# Patient Record
Sex: Female | Born: 1995
Health system: Southern US, Community
[De-identification: ages and names within clinical notes are randomized; demographics above are authoritative.]

## PROBLEM LIST (undated history)

## (undated) DIAGNOSIS — A549 Gonococcal infection, unspecified: Secondary | ICD-10-CM

## (undated) DIAGNOSIS — I10 Essential (primary) hypertension: Secondary | ICD-10-CM

## (undated) DIAGNOSIS — D573 Sickle-cell trait: Secondary | ICD-10-CM

## (undated) DIAGNOSIS — A749 Chlamydial infection, unspecified: Secondary | ICD-10-CM

## (undated) HISTORY — DX: Essential (primary) hypertension: I10

## (undated) HISTORY — PX: ADENOIDECTOMY: SHX5191

## (undated) HISTORY — PX: TONSILLECTOMY: SUR1361

---
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
## 2013-04-12 ENCOUNTER — Emergency Department (HOSPITAL_COMMUNITY)
Admission: EM | Admit: 2013-04-12 | Discharge: 2013-04-12 | Disposition: A | Discharge status: Home / Self Care | Payer: Medicaid Other | Attending: Emergency Medicine | Admitting: Emergency Medicine

## 2013-04-12 ENCOUNTER — Encounter (HOSPITAL_COMMUNITY): Payer: Self-pay | Admitting: Emergency Medicine

## 2013-04-12 DIAGNOSIS — D573 Sickle-cell trait: Secondary | ICD-10-CM | POA: Insufficient documentation

## 2013-04-12 DIAGNOSIS — Z3202 Encounter for pregnancy test, result negative: Secondary | ICD-10-CM | POA: Insufficient documentation

## 2013-04-12 DIAGNOSIS — R109 Unspecified abdominal pain: Secondary | ICD-10-CM | POA: Insufficient documentation

## 2013-04-12 DIAGNOSIS — H6120 Impacted cerumen, unspecified ear: Secondary | ICD-10-CM | POA: Insufficient documentation

## 2013-04-12 DIAGNOSIS — J069 Acute upper respiratory infection, unspecified: Secondary | ICD-10-CM

## 2013-04-12 DIAGNOSIS — Z79899 Other long term (current) drug therapy: Secondary | ICD-10-CM | POA: Insufficient documentation

## 2013-04-12 DIAGNOSIS — H6121 Impacted cerumen, right ear: Secondary | ICD-10-CM

## 2013-04-12 DIAGNOSIS — R51 Headache: Secondary | ICD-10-CM | POA: Insufficient documentation

## 2013-04-12 DIAGNOSIS — Z8719 Personal history of other diseases of the digestive system: Secondary | ICD-10-CM | POA: Insufficient documentation

## 2013-04-12 LAB — URINALYSIS, ROUTINE W REFLEX MICROSCOPIC
Glucose, UA: NEGATIVE mg/dL
Ketones, ur: NEGATIVE mg/dL
Nitrite: NEGATIVE
Specific Gravity, Urine: 1.009 (ref 1.005–1.030)
Urobilinogen, UA: 0.2 mg/dL (ref 0.0–1.0)

## 2013-04-12 LAB — PREGNANCY, URINE: Preg Test, Ur: NEGATIVE

## 2013-04-12 LAB — URINE MICROSCOPIC-ADD ON

## 2013-04-12 MED ORDER — ACETAMINOPHEN 325 MG PO TABS
650.0000 mg | ORAL_TABLET | Freq: Once | ORAL | Status: AC
  Administered 2013-04-12: 650 mg via ORAL
  Filled 2013-04-12: qty 2

## 2013-04-12 NOTE — ED Notes (Signed)
Pt. reports nasal congestion with occasional dry cough unrelieved by OTC cold medications . Denies fever or chills. Occasional upper abdominal cramping .

## 2013-04-12 NOTE — ED Provider Notes (Signed)
CSN: [id number]     Arrival date & time 04/12/13  1603 History  This chart was scribed for non-physician practitioner, Coral Ceo, PA-C working with Raeford Razor, MD by Greggory Stallion, ED scribe. This patient was seen in room TR04C/TR04C and the patient's care was started at 6:22 PM.   Chief Complaint  Patient presents with  . Nasal Congestion   The history is provided by the patient. No language interpreter was used.    HPI Comments: Pamela Patterson is a 17 y.o. female with h/o diverticulitis who presents to the Emergency Department complaining of nasal congestion.  Patient has multiple complaints.  Patient complains of a nonproductive cough and itchy throat that started one week ago.  She denies any hemoptysis, difficulty breathing or swallowing, voice change, chest pain, or shortness of breath. Pt states she has had right ear pain for the past week which is described as an intermittent "popping sensation."  No hearing loss, tinnitus, or ear drainage.  She also has a generalized mild headache for the past week with no vision changes, weakness, loss of sensation, numbness or tingling. She states she is also having intermittent sharp abdominal pain for the past several weeks.  Abdominal pain only lasts a few seconds and resolves.  The abdominal pain is normally right before and after she eats and is located on different locations throughout the abdomen. She denies any abdominal pain currently.  Patient has a history of diverticulitis however she states that her pain is not similar to this. She denies any rectal bleeding or pain, nausea, vomiting, diarrhea, constipation, vaginal discharge, vaginal bleeding, dysuria, or hematuria. Pt has tried OTC cold medications with no relief. She denies appetite change, fever, chills, rash, back pain, neck pain, dizziness, lightheadedness, and leg edema.  Pt denies smoking cigarettes. Sick contacts include her roommate who is also in the emergency room for similar URI  complaints.    Past Medical History  Diagnosis Date  . Diverticulitis   . Sickle cell trait    History reviewed. No pertinent past surgical history. No family history on file. History  Substance Use Topics  . Smoking status: Never Smoker   . Smokeless tobacco: Not on file  . Alcohol Use: No   OB History   Grav Para Term Preterm Abortions TAB SAB Ect Mult Living                 Review of Systems  Constitutional: Negative for fever, chills, activity change, appetite change and fatigue.  HENT: Positive for ear pain, congestion, sore throat and rhinorrhea. Negative for hearing loss, drooling, mouth sores, trouble swallowing, neck pain, neck stiffness, dental problem, voice change, sinus pressure, tinnitus and ear discharge.   Eyes: Negative for photophobia, itching and visual disturbance.  Respiratory: Positive for cough. Negative for apnea, choking, shortness of breath and wheezing.   Cardiovascular: Negative for chest pain, palpitations and leg swelling.  Gastrointestinal: Positive for abdominal pain. Negative for nausea, vomiting, diarrhea, constipation, blood in stool and rectal pain.  Genitourinary: Negative for dysuria, hematuria and difficulty urinating.  Musculoskeletal: Negative for back pain and gait problem.  Skin: Negative for rash and wound.  Neurological: Positive for headaches. Negative for dizziness, syncope, weakness, light-headedness and numbness.  Psychiatric/Behavioral: Negative for confusion.  All other systems reviewed and are negative.    Allergies  Review of patient's allergies indicates no known allergies.  Home Medications   Current Outpatient Rx  Name  Route  Sig  Dispense  Refill  .  Pseudoeph-Doxylamine-DM-APAP (NYQUIL PO)   Oral   Take 30 mLs by mouth at bedtime as needed (congestion).         . Pseudoephedrine-APAP-DM (DAYQUIL PO)   Oral   Take 30 mLs by mouth daily as needed (congestion).          BP 138/77  Pulse 96  Temp(Src) 98.5  F (36.9 C) (Oral)  Resp 18  Ht 5\' 3"  (1.6 m)  Wt 184 lb (83.462 kg)  BMI 32.6 kg/m2  SpO2 99%  LMP 04/06/2013  Filed Vitals:   04/12/13 1625 04/12/13 2011  BP: 138/77 129/83  Pulse: 96 92  Temp: 98.5 F (36.9 C) 98.6 F (37 C)  TempSrc: Oral   Resp: 18 18  Height: 5\' 3"  (1.6 m)   Weight: 184 lb (83.462 kg)   SpO2: 99% 100%     Physical Exam  Nursing note and vitals reviewed. Constitutional: She is oriented to person, place, and time. She appears well-developed and well-nourished. No distress.  HENT:  Head: Normocephalic and atraumatic.  Right Ear: External ear normal.  Left Ear: External ear normal.  Mouth/Throat: Oropharynx is clear and moist. No oropharyngeal exudate.  Left tympanic membrane gray and translucent. Unable to visualize right tympanic membrane due to cerumen. Uvula midline. No trismus.  Rhinorrhea present. No tenderness to palpation to the frontal or maxillary sinuses throughout  Eyes: Conjunctivae and EOM are normal. Pupils are equal, round, and reactive to light. Right eye exhibits no discharge. Left eye exhibits no discharge.  Neck: Normal range of motion. Neck supple. No tracheal deviation present.  No cervical spinal or paraspinal tenderness to palpation. No lymphadenopathy.  Cardiovascular: Normal rate, regular rhythm, normal heart sounds and intact distal pulses.  Exam reveals no gallop and no friction rub.   No murmur heard. Radial and dorsalis pedis pulses present bilaterally  Pulmonary/Chest: Effort normal and breath sounds normal. No respiratory distress. She has no wheezes. She has no rales. She exhibits no tenderness.  Abdominal: Soft. She exhibits no distension and no mass. There is no tenderness. There is no rebound and no guarding.  Musculoskeletal: Normal range of motion. She exhibits no edema and no tenderness.  Patient able to ambulate without difficulty or ataxia.   Lymphadenopathy:    She has no cervical adenopathy.  Neurological: She  is alert and oriented to person, place, and time.  GCS 15. No focal neurological deficits.  Skin: Skin is warm and dry. She is not diaphoretic.  Psychiatric: She has a normal mood and affect. Her behavior is normal.    ED Course  EAR CERUMEN REMOVAL Date/Time: 04/12/2013 7:00 PM Performed by: Coral Ceo K Authorized by: Jillyn Ledger Consent: Verbal consent obtained. Consent given by: patient Patient identity confirmed: verbally with patient Local anesthetic: none Location details: right ear Procedure type: irrigation Patient sedated: no Patient tolerance: Patient tolerated the procedure well with no immediate complications.   (including critical care time)  DIAGNOSTIC STUDIES: Oxygen Saturation is 99% on RA, normal by my interpretation.    COORDINATION OF CARE: 6:28 PM-Discussed treatment plan which includes flushing right ear out and UA with pt at bedside and pt agreed to plan. Advised her xrays and strep test are not necessary and pt agrees.    Labs Review Labs Reviewed  URINALYSIS, ROUTINE W REFLEX MICROSCOPIC - Abnormal; Notable for the following:    APPearance HAZY (*)    Hgb urine dipstick LARGE (*)    Leukocytes, UA SMALL (*)  All other components within normal limits  URINE MICROSCOPIC-ADD ON - Abnormal; Notable for the following:    Squamous Epithelial / LPF MANY (*)    Bacteria, UA FEW (*)    All other components within normal limits  URINE CULTURE  PREGNANCY, URINE   Imaging Review No results found.  Results for orders placed during the hospital encounter of 04/12/13  URINE CULTURE      Result Value Range   Specimen Description URINE, CLEAN CATCH     Special Requests NONE     Culture  Setup Time       Value: 04/12/2013 21:35     Performed at [facility] Count       Value: >=100,000 COLONIES/ML     Performed at [facility]   Culture       Value: KLEBSIELLA PNEUMONIAE     Performed at [facility]    Report Status 04/14/2013 FINAL     Organism ID, Bacteria KLEBSIELLA PNEUMONIAE    URINALYSIS, ROUTINE W REFLEX MICROSCOPIC      Result Value Range   Color, Urine YELLOW  YELLOW   APPearance HAZY (*) CLEAR   Specific Gravity, Urine 1.009  1.005 - 1.030   pH 7.5  5.0 - 8.0   Glucose, UA NEGATIVE  NEGATIVE mg/dL   Hgb urine dipstick LARGE (*) NEGATIVE   Bilirubin Urine NEGATIVE  NEGATIVE   Ketones, ur NEGATIVE  NEGATIVE mg/dL   Protein, ur NEGATIVE  NEGATIVE mg/dL   Urobilinogen, UA 0.2  0.0 - 1.0 mg/dL   Nitrite NEGATIVE  NEGATIVE   Leukocytes, UA SMALL (*) NEGATIVE  PREGNANCY, URINE      Result Value Range   Preg Test, Ur NEGATIVE  NEGATIVE  URINE MICROSCOPIC-ADD ON      Result Value Range   Squamous Epithelial / LPF MANY (*) RARE   WBC, UA 7-10  <3 WBC/hpf   RBC / HPF 0-2  <3 RBC/hpf   Bacteria, UA FEW (*) RARE     MDM   1. URI (upper respiratory infection)   2. Cerumen impaction, right      Pamela Patterson is a 17 y.o. female with h/o diverticulitis who presents to the Emergency Department complaining of nasal congestion and multiple other complaints. Urinalysis and urine pregnancy ordered to further evaluate.   Rechecks  7:00 PM = cerumen flushed out with warm water and hydrogen peroxide. Tympanic membrane is gray and translucent on the right.  7:33 PM = Pt was walking around hall outside of room socializing with her friend in the next room over.   Patient was evaluated in the emergency department for multiple complaints including ear pain, nasal congestion, sore throat, cough, abdominal pain and headache.  Lungs were clear to auscultation and her abdominal exam was benign.  Right ear pain likely due to to a cerumen impaction which was irrigated in the emergency department.  Cough and nasal congestion are likely due to an upper respiratory infection.  Patient had no complaints of dysuria. Urinalysis not highly suggestive of a UTI at this time. Will send for culture.  She was  afebrile, nontoxic in appearance and remained in no acute distress throughout her ED visit. She was instructed to followup with her primary care provider for further evaluation and management of her multiple complaints.  Patient was instructed to return to the ED if they experience any fever, difficulty swallowing or breathing, repeated vomiting, severe or worsening abdominal pain, severe  headache, or other concerns.  Patient was in agreement with discharge and plan.      Final impressions: 1. upper respiratory infection 2. cerumen impaction right     Luiz Iron PA-C   I personally performed the services described in this documentation, which was scribed in my presence. The recorded information has been reviewed and is accurate.   Jillyn Ledger, PA-C 04/15/13 [phone]

## 2013-04-14 LAB — URINE CULTURE: Colony Count: >=100000

## 2013-04-15 NOTE — ED Provider Notes (Signed)
Medical screening examination/treatment/procedure(s) were performed by non-physician practitioner and as supervising physician I was immediately available for consultation/collaboration.  Raeford Razor, MD 04/15/13 [phone]

## 2013-04-16 ENCOUNTER — Telehealth (HOSPITAL_COMMUNITY): Payer: Self-pay | Admitting: Emergency Medicine

## 2013-04-16 NOTE — ED Notes (Signed)
Post ED Visit - Positive Culture Follow-up  Culture report reviewed by antimicrobial stewardship pharmacist: []  [name], Pharm.D., BCPS [x]  [name], Pharm.D., BCPS []  [name], [address].D., BCPS []  [name], [name], BCPS, AAHIVP []  [name], Pharm.D., BCPS, AAHIVP  Positive urine culture No treatment and no further patient follow-up is required at this time.  Pamela Patterson 04/16/2013, 1:38 PM

## 2013-04-25 ENCOUNTER — Emergency Department (HOSPITAL_COMMUNITY)
Admission: EM | Admit: 2013-04-25 | Discharge: 2013-04-25 | Disposition: A | Discharge status: Home / Self Care | Payer: Medicaid Other | Attending: Emergency Medicine | Admitting: Emergency Medicine

## 2013-04-25 ENCOUNTER — Encounter (HOSPITAL_COMMUNITY): Payer: Self-pay | Admitting: *Deleted

## 2013-04-25 DIAGNOSIS — Z862 Personal history of diseases of the blood and blood-forming organs and certain disorders involving the immune mechanism: Secondary | ICD-10-CM | POA: Insufficient documentation

## 2013-04-25 DIAGNOSIS — Z8719 Personal history of other diseases of the digestive system: Secondary | ICD-10-CM | POA: Insufficient documentation

## 2013-04-25 DIAGNOSIS — Y939 Activity, unspecified: Secondary | ICD-10-CM | POA: Insufficient documentation

## 2013-04-25 DIAGNOSIS — S298XXA Other specified injuries of thorax, initial encounter: Secondary | ICD-10-CM | POA: Insufficient documentation

## 2013-04-25 DIAGNOSIS — Y92410 Unspecified street and highway as the place of occurrence of the external cause: Secondary | ICD-10-CM | POA: Insufficient documentation

## 2013-04-25 MED ORDER — IBUPROFEN 800 MG PO TABS: 800.0000 mg | ORAL_TABLET | Freq: Three times a day (TID) | ORAL | Status: DC

## 2013-04-25 MED ORDER — METHOCARBAMOL 500 MG PO TABS: 500.0000 mg | ORAL_TABLET | Freq: Two times a day (BID) | ORAL | Status: DC
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
## 2013-04-25 NOTE — ED Notes (Signed)
Pt called in waiting room by [facility] RN with no succcess pt not in room or waiting area. Discharge instructions left with nurse first in case of pt arrival.

## 2013-04-25 NOTE — ED Notes (Signed)
Pt only c/o chest pain when hyperextends thoracic cavity-no pain in supine/sitting position.

## 2013-04-25 NOTE — ED Provider Notes (Signed)
Medical screening examination/treatment/procedure(s) were performed by non-physician practitioner and as supervising physician I was immediately available for consultation/collaboration.  Breona Cherubin L Colena Ketterman, MD 04/25/13 1720 

## 2013-04-25 NOTE — ED Notes (Signed)
Report recived but pt not in the room.

## 2013-04-25 NOTE — ED Notes (Signed)
Pt left ED without notifying the staff.Pt did not recive discharge instruction and paper.
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
## 2013-07-25 ENCOUNTER — Emergency Department (HOSPITAL_COMMUNITY)
Admission: EM | Admit: 2013-07-25 | Discharge: 2013-07-25 | Disposition: A | Discharge status: Home / Self Care | Payer: BC Managed Care – PPO | Attending: Emergency Medicine | Admitting: Emergency Medicine

## 2013-07-25 ENCOUNTER — Encounter (HOSPITAL_COMMUNITY): Payer: Self-pay | Admitting: Emergency Medicine

## 2013-07-25 DIAGNOSIS — Z8719 Personal history of other diseases of the digestive system: Secondary | ICD-10-CM | POA: Insufficient documentation

## 2013-07-25 DIAGNOSIS — Z862 Personal history of diseases of the blood and blood-forming organs and certain disorders involving the immune mechanism: Secondary | ICD-10-CM | POA: Insufficient documentation

## 2013-07-25 DIAGNOSIS — R319 Hematuria, unspecified: Secondary | ICD-10-CM

## 2013-07-25 DIAGNOSIS — Z791 Long term (current) use of non-steroidal anti-inflammatories (NSAID): Secondary | ICD-10-CM | POA: Insufficient documentation

## 2013-07-25 LAB — URINALYSIS, ROUTINE W REFLEX MICROSCOPIC
Bilirubin Urine: NEGATIVE
GLUCOSE, UA: NEGATIVE mg/dL
Ketones, ur: NEGATIVE mg/dL
LEUKOCYTES UA: NEGATIVE
Nitrite: NEGATIVE
Protein, ur: NEGATIVE mg/dL
Specific Gravity, Urine: 1.01 (ref 1.005–1.030)
Urobilinogen, UA: 0.2 mg/dL (ref 0.0–1.0)
pH: 7.5 (ref 5.0–8.0)

## 2013-07-25 LAB — URINE MICROSCOPIC-ADD ON

## 2013-07-25 LAB — POCT PREGNANCY, URINE: PREG TEST UR: NEGATIVE

## 2013-07-25 NOTE — ED Notes (Signed)
Pt reports having blood in urine since yesterday and urinary frequency, denies any pain.

## 2013-07-25 NOTE — ED Notes (Signed)
Discharge and follow up instructions reviewed with pt. Pt verbalized understanding.  

## 2013-07-25 NOTE — Discharge Instructions (Signed)
As discussed you need to have your urine rechecked in the next two weeks to see if the symptoms resolve. If not then you need to follow up with urology as discussedHematuria, Adult Hematuria (blood in your urine) can be caused by a bladder infection (cystitis), kidney infection (pyelonephritis), prostate infection (prostatitis), or kidney stone. Infections will usually respond to antibiotics (medications which kill germs), and a kidney stone will usually pass through your urine without further treatment. If you were put on antibiotics, take all the medicine until gone. You may feel better in a few days, but take all of your medicine or the infection may not respond and become more difficult to treat. If antibiotics were not given, an infection did not cause the blood in the urine. A further work up to find out the reason may be needed. HOME CARE INSTRUCTIONS   Drink lots of fluid, 3 to 4 quarts a day. If you have been diagnosed with an infection, cranberry juice is especially recommended, in addition to large amounts of water.  Avoid caffeine, tea, and carbonated beverages, because they tend to irritate the bladder.  Avoid alcohol as it may irritate the prostate.  Only take over-the-counter or prescription medicines for pain, discomfort, or fever as directed by your caregiver.  If you have been diagnosed with a kidney stone follow your caregivers instructions regarding straining your urine to catch the stone. TO PREVENT FURTHER INFECTIONS:  Empty the bladder often. Avoid holding urine for long periods of time.  After a bowel movement, women should cleanse front to back. Use each tissue only once.  Empty the bladder before and after sexual intercourse if you are a female.  Return to your caregiver if you develop back pain, fever, nausea (feeling sick to your stomach), vomiting, or your symptoms (problems) are not better in 3 days. Return sooner if you are getting worse. If you have been  requested to return for further testing make sure to keep your appointments. If an infection is not the cause of blood in your urine, X-rays may be required. Your caregiver will discuss this with you. SEEK IMMEDIATE MEDICAL CARE IF:   You have a persistent fever over 102 F (38.9 C).  You develop severe vomiting and are unable to keep the medication down.  You develop severe back or abdominal pain despite taking your medications.  You begin passing a large amount of blood or clots in your urine.  You feel extremely weak or faint, or pass out. MAKE SURE YOU:   Understand these instructions.  Will watch your condition.  Will get help right away if you are not doing well or get worse. Document Released: 07/08/2005 Document Revised: 09/30/2011 Document Reviewed: 02/25/2008 [facility] Patient Information 2014 [place], [place].

## 2013-07-25 NOTE — ED Notes (Signed)
Pt states she has some urinary frequency, no pain but noticed a moderate amt of blood in urine. Pt denies any pain at this moment. Pt states she has an irregular menstrual cycle.

## 2013-07-25 NOTE — ED Provider Notes (Signed)
CSN: [id number]     Arrival date & time 07/25/13  1404 History   First MD Initiated Contact with Patient 07/25/13 1527     Chief Complaint  Patient presents with  . Hematuria   (Consider location/radiation/quality/duration/timing/severity/associated sxs/prior Treatment) HPI Comments: Pt denies pain or any other symptoms besides blood in the urine  Patient is a 18 y.o. female presenting with hematuria. The history is provided by the patient. No language interpreter was used.  Hematuria This is a new problem. The current episode started today. The problem occurs constantly. The problem has been unchanged. Pertinent negatives include no abdominal pain, fever or vomiting. Nothing aggravates the symptoms. She has tried nothing for the symptoms.    Past Medical History  Diagnosis Date  . Diverticulitis   . Sickle cell trait    History reviewed. No pertinent past surgical history. History reviewed. No pertinent family history. History  Substance Use Topics  . Smoking status: Never Smoker   . Smokeless tobacco: Not on file  . Alcohol Use: No   OB History   Grav Para Term Preterm Abortions TAB SAB Ect Mult Living                 Review of Systems  Constitutional: Negative for fever.  Respiratory: Negative.   Cardiovascular: Negative.   Gastrointestinal: Negative for vomiting and abdominal pain.  Genitourinary: Positive for hematuria.    Allergies  Review of patient's allergies indicates no known allergies.  Home Medications   Current Outpatient Rx  Name  Route  Sig  Dispense  Refill  . ibuprofen (ADVIL,MOTRIN) 800 MG tablet   Oral   Take 1 tablet (800 mg total) by mouth 3 (three) times daily.   21 tablet   0    BP 137/84  Pulse 82  Temp(Src) 97.6 F (36.4 C) (Oral)  Resp 18  Wt 197 lb 6.4 oz (89.54 kg)  SpO2 100% Physical Exam  Nursing note and vitals reviewed. Constitutional: She is oriented to person, place, and time. She appears well-developed and  well-nourished.  Cardiovascular: Normal rate and regular rhythm.   Pulmonary/Chest: Effort normal and breath sounds normal.  Abdominal: Soft. Bowel sounds are normal. There is no tenderness.  Musculoskeletal: Normal range of motion.  Neurological: She is alert and oriented to person, place, and time.    ED Course  Procedures (including critical care time) Labs Review Labs Reviewed  URINALYSIS, ROUTINE W REFLEX MICROSCOPIC - Abnormal; Notable for the following:    Hgb urine dipstick MODERATE (*)    All other components within normal limits  URINE MICROSCOPIC-ADD ON - Abnormal; Notable for the following:    Bacteria, UA FEW (*)    All other components within normal limits  URINE CULTURE  POCT PREGNANCY, URINE   Imaging Review No results found.  EKG Interpretation   None       MDM   1. Hematuria    No infection noted:pt is not having any pain:urine sent for culture:pt given referral to urology:denies vaginal discharge    Teressa LowerVrinda Lamel Mccarley, NP 07/25/13 1557

## 2013-07-26 NOTE — ED Provider Notes (Signed)
Medical screening examination/treatment/procedure(s) were performed by non-physician practitioner and as supervising physician I was immediately available for consultation/collaboration.  EKG Interpretation   None         Gwyneth SproutWhitney Breena Bevacqua, MD 07/26/13 1541

## 2013-07-27 LAB — URINE CULTURE: Colony Count: 80000

## 2013-09-15 ENCOUNTER — Telehealth (HOSPITAL_COMMUNITY): Payer: Self-pay

## 2013-09-15 NOTE — ED Notes (Signed)
Call from pt requesting name of Urologist referred to at last visit.  Pt provided name and contact info for Dr Mena GoesEskridge.

## 2013-09-24 ENCOUNTER — Encounter (HOSPITAL_COMMUNITY): Payer: Self-pay | Admitting: Emergency Medicine

## 2013-09-24 ENCOUNTER — Emergency Department (INDEPENDENT_AMBULATORY_CARE_PROVIDER_SITE_OTHER)
Admission: EM | Admit: 2013-09-24 | Discharge: 2013-09-24 | Disposition: A | Discharge status: Home / Self Care | Payer: BC Managed Care – PPO | Source: Home / Self Care | Attending: Family Medicine | Admitting: Family Medicine

## 2013-09-24 DIAGNOSIS — N390 Urinary tract infection, site not specified: Secondary | ICD-10-CM

## 2013-09-24 LAB — POCT URINALYSIS DIP (DEVICE)
Bilirubin Urine: NEGATIVE
Glucose, UA: NEGATIVE mg/dL
Hgb urine dipstick: NEGATIVE
KETONES UR: NEGATIVE mg/dL
NITRITE: NEGATIVE
PROTEIN: NEGATIVE mg/dL
Specific Gravity, Urine: 1.015 (ref 1.005–1.030)
Urobilinogen, UA: 0.2 mg/dL (ref 0.0–1.0)
pH: 6 (ref 5.0–8.0)

## 2013-09-24 LAB — POCT PREGNANCY, URINE: PREG TEST UR: NEGATIVE

## 2013-09-24 MED ORDER — CEPHALEXIN 500 MG PO CAPS: 500.0000 mg | ORAL_CAPSULE | Freq: Two times a day (BID) | ORAL | Status: DC

## 2013-09-24 NOTE — Discharge Instructions (Signed)
Thank you for coming in today. Take Keflex twice daily for one week Followup as needed If your belly pain worsens, or you have high fever, bad vomiting, blood in your stool or black tarry stool go to the Emergency Room.   Urinary Tract Infection Urinary tract infections (UTIs) can develop anywhere along your urinary tract. Your urinary tract is your body's drainage system for removing wastes and extra water. Your urinary tract includes two kidneys, two ureters, a bladder, and a urethra. Your kidneys are a pair of bean-shaped organs. Each kidney is about the size of your fist. They are located below your ribs, one on each side of your spine. CAUSES Infections are caused by microbes, which are microscopic organisms, including fungi, viruses, and bacteria. These organisms are so small that they can only be seen through a microscope. Bacteria are the microbes that most commonly cause UTIs. SYMPTOMS  Symptoms of UTIs may vary by age and gender of the patient and by the location of the infection. Symptoms in young women typically include a frequent and intense urge to urinate and a painful, burning feeling in the bladder or urethra during urination. Older women and men are more likely to be tired, shaky, and weak and have muscle aches and abdominal pain. A fever may mean the infection is in your kidneys. Other symptoms of a kidney infection include pain in your back or sides below the ribs, nausea, and vomiting. DIAGNOSIS To diagnose a UTI, your caregiver will ask you about your symptoms. Your caregiver also will ask to provide a urine sample. The urine sample will be tested for bacteria and white blood cells. White blood cells are made by your body to help fight infection. TREATMENT  Typically, UTIs can be treated with medication. Because most UTIs are caused by a bacterial infection, they usually can be treated with the use of antibiotics. The choice of antibiotic and length of treatment depend on your  symptoms and the type of bacteria causing your infection. HOME CARE INSTRUCTIONS  If you were prescribed antibiotics, take them exactly as your caregiver instructs you. Finish the medication even if you feel better after you have only taken some of the medication.  Drink enough water and fluids to keep your urine clear or pale yellow.  Avoid caffeine, tea, and carbonated beverages. They tend to irritate your bladder.  Empty your bladder often. Avoid holding urine for long periods of time.  Empty your bladder before and after sexual intercourse.  After a bowel movement, women should cleanse from front to back. Use each tissue only once. SEEK MEDICAL CARE IF:   You have back pain.  You develop a fever.  Your symptoms do not begin to resolve within 3 days. SEEK IMMEDIATE MEDICAL CARE IF:   You have severe back pain or lower abdominal pain.  You develop chills.  You have nausea or vomiting.  You have continued burning or discomfort with urination. MAKE SURE YOU:   Understand these instructions.  Will watch your condition.  Will get help right away if you are not doing well or get worse. Document Released: 04/17/2005 Document Revised: 01/07/2012 Document Reviewed: 08/16/2011 [facility] Patient Information 2014 [place], [place].

## 2013-09-24 NOTE — ED Notes (Signed)
Pt requesting std check.  Denies any symptoms.   Pt is concerned because she had blood in her urine when seen at the ER.   Denies lower back pain and any abnormal discharge.

## 2013-09-24 NOTE — ED Provider Notes (Signed)
Abel PrestoKeon Inglis is a 18 y.o. female who presents to Urgent Care today for urinary urgency frequency dysuria and intermittent hematuria. This is been present for a few weeks. No fevers chills nausea vomiting or diarrhea. Last menstrual period was in early February. No history of previous UTI. No vaginal discharge.   Past Medical History  Diagnosis Date  . Diverticulitis   . Sickle cell trait    History  Substance Use Topics  . Smoking status: Never Smoker   . Smokeless tobacco: Not on file  . Alcohol Use: No   ROS as above Medications: No current facility-administered medications for this encounter.   Current Outpatient Prescriptions  Medication Sig Dispense Refill  . cephALEXin (KEFLEX) 500 MG capsule Take 1 capsule (500 mg total) by mouth 2 (two) times daily.  14 capsule  0  . ibuprofen (ADVIL,MOTRIN) 800 MG tablet Take 1 tablet (800 mg total) by mouth 3 (three) times daily.  21 tablet  0    Exam:  BP 121/73  Pulse 80  Temp(Src) 98 F (36.7 C) (Oral)  Resp 16  SpO2 100%  LMP 08/30/2013 Gen: Well NAD Lungs: Normal work of breathing. CTABL Heart: RRR no MRG Abd: NABS, Soft. NT, ND no CV angle tenderness to percussion Exts: Brisk capillary refill, warm and well perfused.   Results for orders placed during the hospital encounter of 09/24/13 (from the past 24 hour(s))  POCT URINALYSIS DIP (DEVICE)     Status: Abnormal   Collection Time    09/24/13  4:10 PM      Result Value Ref Range   Glucose, UA NEGATIVE  NEGATIVE mg/dL   Bilirubin Urine NEGATIVE  NEGATIVE   Ketones, ur NEGATIVE  NEGATIVE mg/dL   Specific Gravity, Urine 1.015  1.005 - 1.030   Hgb urine dipstick NEGATIVE  NEGATIVE   pH 6.0  5.0 - 8.0   Protein, ur NEGATIVE  NEGATIVE mg/dL   Urobilinogen, UA 0.2  0.0 - 1.0 mg/dL   Nitrite NEGATIVE  NEGATIVE   Leukocytes, UA SMALL (*) NEGATIVE  POCT PREGNANCY, URINE     Status: None   Collection Time    09/24/13  4:15 PM      Result Value Ref Range   Preg Test, Ur  NEGATIVE  NEGATIVE   No results found.  Assessment and Plan: 18 y.o. female with UTI. Plan for empiric treatment with Keflex. Followup as needed.  Discussed warning signs or symptoms. Please see discharge instructions. Patient expresses understanding.    Rodolph BongEvan S Geniene List, MD 09/24/13 [phone]

## 2014-04-04 ENCOUNTER — Encounter (HOSPITAL_COMMUNITY): Payer: Self-pay | Admitting: Emergency Medicine

## 2014-04-04 ENCOUNTER — Emergency Department (HOSPITAL_COMMUNITY)
Admission: EM | Admit: 2014-04-04 | Discharge: 2014-04-04 | Discharge status: Against Medical Advice | Payer: BC Managed Care – PPO | Attending: Emergency Medicine | Admitting: Emergency Medicine

## 2014-04-04 DIAGNOSIS — R350 Frequency of micturition: Secondary | ICD-10-CM | POA: Insufficient documentation

## 2014-04-04 DIAGNOSIS — R109 Unspecified abdominal pain: Secondary | ICD-10-CM | POA: Insufficient documentation

## 2014-04-04 DIAGNOSIS — R1110 Vomiting, unspecified: Secondary | ICD-10-CM | POA: Insufficient documentation

## 2014-04-04 LAB — COMPREHENSIVE METABOLIC PANEL
ALBUMIN: 4 g/dL (ref 3.5–5.2)
ALK PHOS: 69 U/L (ref 39–117)
ALT: 19 U/L (ref 0–35)
AST: 22 U/L (ref 0–37)
Anion gap: 13 (ref 5–15)
BUN: 10 mg/dL (ref 6–23)
CO2: 24 mEq/L (ref 19–32)
Calcium: 9.3 mg/dL (ref 8.4–10.5)
Chloride: 103 mEq/L (ref 96–112)
Creatinine, Ser: 0.77 mg/dL (ref 0.50–1.10)
GFR calc Af Amer: >90 mL/min (ref >90)
GFR calc non Af Amer: >90 mL/min (ref >90)
Glucose, Bld: 87 mg/dL (ref 70–99)
POTASSIUM: 4 meq/L (ref 3.7–5.3)
Sodium: 140 mEq/L (ref 137–147)
TOTAL PROTEIN: 7 g/dL (ref 6.0–8.3)
Total Bilirubin: 0.5 mg/dL (ref 0.3–1.2)

## 2014-04-04 LAB — URINALYSIS, ROUTINE W REFLEX MICROSCOPIC
Bilirubin Urine: NEGATIVE
Glucose, UA: NEGATIVE mg/dL
Hgb urine dipstick: NEGATIVE
Ketones, ur: NEGATIVE mg/dL
LEUKOCYTES UA: NEGATIVE
Nitrite: NEGATIVE
Protein, ur: NEGATIVE mg/dL
Specific Gravity, Urine: 1.014 (ref 1.005–1.030)
Urobilinogen, UA: 0.2 mg/dL (ref 0.0–1.0)
pH: 5.5 (ref 5.0–8.0)

## 2014-04-04 LAB — CBC WITH DIFFERENTIAL/PLATELET
BASOS ABS: 0 10*3/uL (ref 0.0–0.1)
BASOS PCT: 0 % (ref 0–1)
Eosinophils Absolute: 0.1 10*3/uL (ref 0.0–0.7)
Eosinophils Relative: 1 % (ref 0–5)
HCT: 35.7 % — ABNORMAL LOW (ref 36.0–46.0)
HEMOGLOBIN: 12.7 g/dL (ref 12.0–15.0)
Lymphocytes Relative: 34 % (ref 12–46)
Lymphs Abs: 3.1 10*3/uL (ref 0.7–4.0)
MCH: 31.4 pg (ref 26.0–34.0)
MCHC: 35.6 g/dL (ref 30.0–36.0)
MCV: 88.1 fL (ref 78.0–100.0)
MONOS PCT: 6 % (ref 3–12)
Monocytes Absolute: 0.6 10*3/uL (ref 0.1–1.0)
NEUTROS ABS: 5.2 10*3/uL (ref 1.7–7.7)
NEUTROS PCT: 59 % (ref 43–77)
PLATELETS: 185 10*3/uL (ref 150–400)
RBC: 4.05 MIL/uL (ref 3.87–5.11)
RDW: 12.1 % (ref 11.5–15.5)
WBC: 9 10*3/uL (ref 4.0–10.5)

## 2014-04-04 LAB — POC URINE PREG, ED: Preg Test, Ur: NEGATIVE

## 2014-04-04 LAB — LIPASE, BLOOD: Lipase: 20 U/L (ref 11–59)

## 2014-04-04 NOTE — ED Notes (Signed)
Pt reports abdominal cramping after eating for the past 4 days mostly in lower abdomen. Pt has vomited x1. Also reports urinary frequency.

## 2014-04-09 ENCOUNTER — Emergency Department (HOSPITAL_COMMUNITY)
Admission: EM | Admit: 2014-04-09 | Discharge: 2014-04-09 | Disposition: A | Discharge status: Home / Self Care | Payer: BC Managed Care – PPO | Attending: Emergency Medicine | Admitting: Emergency Medicine

## 2014-04-09 ENCOUNTER — Encounter (HOSPITAL_COMMUNITY): Payer: Self-pay | Admitting: Emergency Medicine

## 2014-04-09 DIAGNOSIS — Z8719 Personal history of other diseases of the digestive system: Secondary | ICD-10-CM | POA: Insufficient documentation

## 2014-04-09 DIAGNOSIS — R197 Diarrhea, unspecified: Secondary | ICD-10-CM | POA: Insufficient documentation

## 2014-04-09 DIAGNOSIS — Z862 Personal history of diseases of the blood and blood-forming organs and certain disorders involving the immune mechanism: Secondary | ICD-10-CM | POA: Insufficient documentation

## 2014-04-09 DIAGNOSIS — R1084 Generalized abdominal pain: Secondary | ICD-10-CM | POA: Insufficient documentation

## 2014-04-09 LAB — COMPREHENSIVE METABOLIC PANEL
ALBUMIN: 3.8 g/dL (ref 3.5–5.2)
ALT: 13 U/L (ref 0–35)
ANION GAP: 11 (ref 5–15)
AST: 16 U/L (ref 0–37)
Alkaline Phosphatase: 64 U/L (ref 39–117)
BUN: 9 mg/dL (ref 6–23)
CO2: 23 mEq/L (ref 19–32)
CREATININE: 0.65 mg/dL (ref 0.50–1.10)
Calcium: 9.2 mg/dL (ref 8.4–10.5)
Chloride: 103 mEq/L (ref 96–112)
GFR calc Af Amer: >90 mL/min (ref >90)
GFR calc non Af Amer: >90 mL/min (ref >90)
Glucose, Bld: 85 mg/dL (ref 70–99)
POTASSIUM: 3.9 meq/L (ref 3.7–5.3)
Sodium: 137 mEq/L (ref 137–147)
Total Bilirubin: 0.6 mg/dL (ref 0.3–1.2)
Total Protein: 6.7 g/dL (ref 6.0–8.3)

## 2014-04-09 LAB — CBC WITH DIFFERENTIAL/PLATELET
Basophils Absolute: 0 10*3/uL (ref 0.0–0.1)
Basophils Relative: 0 % (ref 0–1)
EOS PCT: 1 % (ref 0–5)
Eosinophils Absolute: 0.1 10*3/uL (ref 0.0–0.7)
HEMATOCRIT: 34.8 % — AB (ref 36.0–46.0)
Hemoglobin: 12.5 g/dL (ref 12.0–15.0)
LYMPHS ABS: 2.3 10*3/uL (ref 0.7–4.0)
Lymphocytes Relative: 36 % (ref 12–46)
MCH: 30.9 pg (ref 26.0–34.0)
MCHC: 35.9 g/dL (ref 30.0–36.0)
MCV: 86.1 fL (ref 78.0–100.0)
MONO ABS: 0.5 10*3/uL (ref 0.1–1.0)
Monocytes Relative: 7 % (ref 3–12)
NEUTROS ABS: 3.5 10*3/uL (ref 1.7–7.7)
Neutrophils Relative %: 56 % (ref 43–77)
PLATELETS: 220 10*3/uL (ref 150–400)
RBC: 4.04 MIL/uL (ref 3.87–5.11)
RDW: 12 % (ref 11.5–15.5)
WBC: 6.3 10*3/uL (ref 4.0–10.5)

## 2014-04-09 MED ORDER — LOPERAMIDE HCL 2 MG PO CAPS: ORAL_CAPSULE | ORAL | Status: DC

## 2014-04-09 NOTE — ED Provider Notes (Signed)
CSN: [id number]     Arrival date & time 04/09/14  1247 History   First MD Initiated Contact with Patient 04/09/14 1429     Chief Complaint  Patient presents with  . Results  . Diarrhea     (Consider location/radiation/quality/duration/timing/severity/associated sxs/prior Treatment) HPI 18 year old female 5 days ago had vomiting diarrhea and crampy abdominal pain was in the ED and had labs drawn but did not stay for results found today is much better with no vomiting and only 2 nonbloody loose stools today with intermittent crampy abdominal pain shortly before she has diarrhea and no abdominal pain after diarrhea with no abdominal pain now with no fever chest pain no shortness of breath and no other concerns with negative pregnancy test and negative urine tests within the last week. Patient came back to the emergency room today requesting lab results. Past Medical History  Diagnosis Date  . Diverticulitis   . Sickle cell trait    History reviewed. No pertinent past surgical history. History reviewed. No pertinent family history. History  Substance Use Topics  . Smoking status: Never Smoker   . Smokeless tobacco: Not on file  . Alcohol Use: No   OB History   Grav Para Term Preterm Abortions TAB SAB Ect Mult Living                 Review of Systems 10 Systems reviewed and are negative for acute change except as noted in the HPI.   Allergies  Review of patient's allergies indicates no known allergies.  Home Medications   Prior to Admission medications   Medication Sig Start Date End Date Taking? Authorizing Provider  acetaminophen (TYLENOL) 500 MG tablet Take 500 mg by mouth every 6 (six) hours as needed for mild pain.   Yes Historical Provider, MD  diphenhydrAMINE (BENADRYL) 25 mg capsule Take 25 mg by mouth daily as needed for itching.   Yes Historical Provider, MD  loperamide (IMODIUM) 2 MG capsule Take two tabs po initially, then one tab after each loose stool: max 8 tabs  in 24 hours 04/09/14   Hurman Horn, MD   BP 111/78  Pulse 66  Temp(Src) 98.8 F (37.1 C) (Oral)  Resp 16  Ht  (1.6 m)  Wt 170 lb 12.8 oz (77.474 kg)  BMI 30.26 kg/m2  SpO2 99%  LMP 03/10/2014 Physical Exam  Nursing note and vitals reviewed. Constitutional:  Awake, alert, nontoxic appearance.  HENT:  Head: Atraumatic.  Eyes: Right eye exhibits no discharge. Left eye exhibits no discharge.  Neck: Neck supple.  Cardiovascular: Normal rate and regular rhythm.   No murmur heard. Pulmonary/Chest: Effort normal and breath sounds normal. No respiratory distress. She has no wheezes. She has no rales. She exhibits no tenderness.  Abdominal: Soft. Bowel sounds are normal. She exhibits no distension and no mass. There is tenderness. There is no rebound and no guarding.  Minimal if any diffuse tenderness without rebound  Musculoskeletal: She exhibits no tenderness.  Baseline ROM, no obvious new focal weakness.  Neurological: She is alert.  Mental status and motor strength appears baseline for patient and situation.  Skin: No rash noted.  Psychiatric: She has a normal mood and affect.    ED Course  Procedures (including critical care time) Patient informed of clinical course, understand medical decision-making process, and agree with plan. Labs Review Labs Reviewed  CBC WITH DIFFERENTIAL - Abnormal; Notable for the following:    HCT 34.8 (*)    All other  components within normal limits  COMPREHENSIVE METABOLIC PANEL    Imaging Review No results found.   EKG Interpretation None      MDM   Final diagnoses:  Diarrhea    I doubt any other EMC precluding discharge at this time including, but not necessarily limited to the following:SBI.    Hurman Horn, MD 04/20/14 [phone]

## 2014-04-09 NOTE — Discharge Instructions (Signed)

## 2014-04-09 NOTE — ED Notes (Signed)
Came to [place] 5 days ago for n/v and diarrhea.  Here to have edp review results. Pt. Left ama due to long wait. Still having some diarrhea.

## 2014-04-09 NOTE — ED Notes (Signed)
Pt placed on monitor. Pt monitored by blood pressure and pulse ox.  

## 2014-05-28 ENCOUNTER — Encounter (HOSPITAL_COMMUNITY): Payer: Self-pay | Admitting: Emergency Medicine

## 2014-05-28 ENCOUNTER — Emergency Department (HOSPITAL_COMMUNITY)
Admission: EM | Admit: 2014-05-28 | Discharge: 2014-05-29 | Disposition: A | Discharge status: Home / Self Care | Payer: Medicaid Other | Attending: Emergency Medicine | Admitting: Emergency Medicine

## 2014-05-28 DIAGNOSIS — Z8719 Personal history of other diseases of the digestive system: Secondary | ICD-10-CM | POA: Insufficient documentation

## 2014-05-28 DIAGNOSIS — R109 Unspecified abdominal pain: Secondary | ICD-10-CM

## 2014-05-28 DIAGNOSIS — Z8619 Personal history of other infectious and parasitic diseases: Secondary | ICD-10-CM | POA: Insufficient documentation

## 2014-05-28 DIAGNOSIS — R112 Nausea with vomiting, unspecified: Secondary | ICD-10-CM | POA: Insufficient documentation

## 2014-05-28 DIAGNOSIS — R1011 Right upper quadrant pain: Secondary | ICD-10-CM | POA: Insufficient documentation

## 2014-05-28 DIAGNOSIS — Z3202 Encounter for pregnancy test, result negative: Secondary | ICD-10-CM | POA: Insufficient documentation

## 2014-05-28 LAB — COMPREHENSIVE METABOLIC PANEL
ALT: 24 U/L (ref 0–35)
AST: 27 U/L (ref 0–37)
Albumin: 3.8 g/dL (ref 3.5–5.2)
Alkaline Phosphatase: 76 U/L (ref 39–117)
Anion gap: 13 (ref 5–15)
BILIRUBIN TOTAL: 0.5 mg/dL (ref 0.3–1.2)
BUN: 10 mg/dL (ref 6–23)
CHLORIDE: 106 meq/L (ref 96–112)
CO2: 22 meq/L (ref 19–32)
CREATININE: 0.91 mg/dL (ref 0.50–1.10)
Calcium: 9.3 mg/dL (ref 8.4–10.5)
GLUCOSE: 78 mg/dL (ref 70–99)
Potassium: 4.3 mEq/L (ref 3.7–5.3)
Sodium: 141 mEq/L (ref 137–147)
Total Protein: 7.4 g/dL (ref 6.0–8.3)

## 2014-05-28 LAB — CBC WITH DIFFERENTIAL/PLATELET
Basophils Absolute: 0 10*3/uL (ref 0.0–0.1)
Basophils Relative: 0 % (ref 0–1)
Eosinophils Absolute: 0 10*3/uL (ref 0.0–0.7)
Eosinophils Relative: 1 % (ref 0–5)
HCT: 37.4 % (ref 36.0–46.0)
Hemoglobin: 13.2 g/dL (ref 12.0–15.0)
LYMPHS ABS: 1.9 10*3/uL (ref 0.7–4.0)
LYMPHS PCT: 24 % (ref 12–46)
MCH: 30.9 pg (ref 26.0–34.0)
MCHC: 35.3 g/dL (ref 30.0–36.0)
MCV: 87.6 fL (ref 78.0–100.0)
MONO ABS: 0.6 10*3/uL (ref 0.1–1.0)
Monocytes Relative: 8 % (ref 3–12)
NEUTROS ABS: 5.2 10*3/uL (ref 1.7–7.7)
Neutrophils Relative %: 67 % (ref 43–77)
Platelets: 217 10*3/uL (ref 150–400)
RBC: 4.27 MIL/uL (ref 3.87–5.11)
RDW: 12.2 % (ref 11.5–15.5)
WBC: 7.8 10*3/uL (ref 4.0–10.5)

## 2014-05-28 LAB — PREGNANCY, URINE: Preg Test, Ur: NEGATIVE

## 2014-05-28 LAB — LIPASE, BLOOD: LIPASE: 12 U/L (ref 11–59)

## 2014-05-28 MED ORDER — SODIUM CHLORIDE 0.9 % IV BOLUS (SEPSIS)
1000.0000 mL | Freq: Once | INTRAVENOUS | Status: AC
  Administered 2014-05-28: 1000 mL via INTRAVENOUS

## 2014-05-28 NOTE — ED Provider Notes (Signed)
CSN: [id number]     Arrival date & time 05/28/14  2204 History   First MD Initiated Contact with Patient 05/28/14 2217     Chief Complaint  Patient presents with  . Abdominal Pain     (Consider location/radiation/quality/duration/timing/severity/associated sxs/prior Treatment) HPI Comments: Patient is a 18 year old female past medical history significant for sickle cell trait presenting to [facility] the emergency department for acute onset right upper quadrant abdominal pain with radiation to her back. She states the pain began around 6 PM after eating some chicken wings. She had some nausea and one episode of nonbloody nonbilious emesis. Patient states that her pain is sharp in nature has eased off a little since it first began. No abdominal surgical history.last menstrual period was 04-20-14.   Past Medical History  Diagnosis Date  . Diverticulitis   . Sickle cell trait    No past surgical history on file. No family history on file. History  Substance Use Topics  . Smoking status: Never Smoker   . Smokeless tobacco: Not on file  . Alcohol Use: No   OB History    No data available     Review of Systems  Gastrointestinal: Positive for nausea, vomiting and abdominal pain. Negative for diarrhea.  All other systems reviewed and are negative.     Allergies  Review of patient's allergies indicates no known allergies.  Home Medications   Prior to Admission medications   Medication Sig Start Date End Date Taking? Authorizing Provider  loperamide (IMODIUM) 2 MG capsule Take two tabs po initially, then one tab after each loose stool: max 8 tabs in 24 hours Patient not taking: Reported on 05/28/2014 04/09/14   Hurman HornJohn M Bednar, MD   BP 149/80 mmHg  Pulse 97  Temp(Src) 98.1 F (36.7 C) (Oral)  Resp 18  SpO2 100%  LMP 04/20/2014 Physical Exam  Constitutional: She is oriented to person, place, and time. She appears well-developed and well-nourished. No distress.  HENT:  Head:  Normocephalic and atraumatic.  Right Ear: External ear normal.  Left Ear: External ear normal.  Nose: Nose normal.  Mouth/Throat: Oropharynx is clear and moist. No oropharyngeal exudate.  Eyes: Conjunctivae are normal.  Neck: Neck supple.  Cardiovascular: Normal rate, regular rhythm and normal heart sounds.   Pulmonary/Chest: Effort normal and breath sounds normal. No respiratory distress.  Abdominal: Soft. Normal appearance and bowel sounds are normal. She exhibits no distension. There is tenderness in the right upper quadrant. There is no rigidity, no rebound, no guarding and no CVA tenderness.  Musculoskeletal: Normal range of motion.  Neurological: She is alert and oriented to person, place, and time.  Skin: Skin is warm and dry. She is not diaphoretic.  Nursing note and vitals reviewed.   ED Course  Procedures (including critical care time) Medications  sodium chloride 0.9 % bolus 1,000 mL (1,000 mLs Intravenous New Bag/Given 05/28/14 2240)    Labs Review Labs Reviewed  CBC WITH DIFFERENTIAL  COMPREHENSIVE METABOLIC PANEL  LIPASE, BLOOD  PREGNANCY, URINE    Imaging Review No results found.   EKG Interpretation None      MDM   Final diagnoses:  Abdominal pain    Filed Vitals:   05/29/14 0159  BP: 118/75  Pulse: 85  Temp: 98.1 F (36.7 C)  Resp:    Afebrile, NAD, non-toxic appearing, AAOx4.  Patient is nontoxic, nonseptic appearing, in no apparent distress.  Patient's pain and other symptoms adequately managed in emergency department.   Labs, imaging and  vitals reviewed.  Patient does not meet the SIRS or Sepsis criteria.  On repeat exam patient does not have a surgical abdomin and there are no peritoneal signs.  No indication of appendicitis, bowel obstruction, bowel perforation, cholecystitis, diverticulitis, PID or ectopic pregnancy.  Patient discharged home with symptomatic treatment and given strict instructions for follow-up with their primary care  physician.  I have also discussed reasons to return immediately to the ER.  Patient expresses understanding and agrees with plan. Patient is stable at time of discharge        Jeannetta EllisJennifer L Armond Cuthrell, PA-C 05/29/14 0230  Gerhard Munchobert Lockwood, MD 05/29/14 1534

## 2014-05-28 NOTE — ED Notes (Signed)
Patient arrives via EMS with complaint of Abdominal pain after eating tonight. States that she ate some chicken wings, and normally has no issues with that type of food. Explains that about 2 mins after eating she began to have severe abdominal pain which radiated to right flank. States that appetite has decreased for about 2 weeks, but no symptoms like this. LMP 04/20/14.

## 2014-05-29 ENCOUNTER — Emergency Department (HOSPITAL_COMMUNITY): Payer: Medicaid Other

## 2014-05-29 NOTE — ED Notes (Signed)
Pt to US at this time.

## 2014-05-29 NOTE — Discharge Instructions (Signed)
Please follow up with your primary care physician in 1-2 days. If you do not have one [facility]. Please avoid fried or greasy foods to avoid triggering abdominal pain. Please read all discharge instructions and return precautions.    Abdominal Pain Many things can cause abdominal pain. Usually, abdominal pain is not caused by a disease and will improve without treatment. It can often be observed and treated at home. Your health care provider will do a physical exam and possibly order blood tests and X-rays to help determine the seriousness of your pain. However, in many cases, more time must pass before a clear cause of the pain can be found. Before that point, your health care provider may not know if you need more testing or further treatment. HOME CARE INSTRUCTIONS  Monitor your abdominal pain for any changes. The following actions may help to alleviate any discomfort you are experiencing:  Only take over-the-counter or prescription medicines as directed by your health care provider.  Do not take laxatives unless directed to do so by your health care provider.  Try a clear liquid diet (broth, tea, or water) as directed by your health care provider. Slowly move to a bland diet as tolerated. SEEK MEDICAL CARE IF:  You have unexplained abdominal pain.  You have abdominal pain associated with nausea or diarrhea.  You have pain when you urinate or have a bowel movement.  You experience abdominal pain that wakes you in the night.  You have abdominal pain that is worsened or improved by eating food.  You have abdominal pain that is worsened with eating fatty foods.  You have a fever. SEEK IMMEDIATE MEDICAL CARE IF:   Your pain does not go away within 2 hours.  You keep throwing up (vomiting).  Your pain is felt only in portions of the abdomen, such as the right side or the left lower portion of the abdomen.  You pass bloody or  black tarry stools. MAKE SURE YOU:  Understand these instructions.   Will watch your condition.   Will get help right away if you are not doing well or get worse.  Document Released: 04/17/2005 Document Revised: 07/13/2013 Document Reviewed: 03/17/2013 [facility] Patient Information 2015 [place], [place]. This information is not intended to replace advice given to you by your health care provider. Make sure you discuss any questions you have with your health care provider.

## 2014-07-26 ENCOUNTER — Emergency Department (HOSPITAL_COMMUNITY)
Admission: EM | Admit: 2014-07-26 | Discharge: 2014-07-26 | Disposition: A | Discharge status: Home / Self Care | Payer: Medicaid Other | Attending: Emergency Medicine | Admitting: Emergency Medicine

## 2014-07-26 ENCOUNTER — Encounter (HOSPITAL_COMMUNITY): Payer: Self-pay | Admitting: Emergency Medicine

## 2014-07-26 DIAGNOSIS — Z862 Personal history of diseases of the blood and blood-forming organs and certain disorders involving the immune mechanism: Secondary | ICD-10-CM | POA: Insufficient documentation

## 2014-07-26 DIAGNOSIS — Z720 Tobacco use: Secondary | ICD-10-CM | POA: Insufficient documentation

## 2014-07-26 DIAGNOSIS — K2970 Gastritis, unspecified, without bleeding: Secondary | ICD-10-CM | POA: Insufficient documentation

## 2014-07-26 DIAGNOSIS — R1013 Epigastric pain: Secondary | ICD-10-CM

## 2014-07-26 DIAGNOSIS — Z79899 Other long term (current) drug therapy: Secondary | ICD-10-CM | POA: Insufficient documentation

## 2014-07-26 DIAGNOSIS — Z3202 Encounter for pregnancy test, result negative: Secondary | ICD-10-CM | POA: Insufficient documentation

## 2014-07-26 LAB — URINALYSIS, ROUTINE W REFLEX MICROSCOPIC
Bilirubin Urine: NEGATIVE
Glucose, UA: NEGATIVE mg/dL
Hgb urine dipstick: NEGATIVE
KETONES UR: NEGATIVE mg/dL
Leukocytes, UA: NEGATIVE
NITRITE: NEGATIVE
Protein, ur: NEGATIVE mg/dL
Specific Gravity, Urine: 1.012 (ref 1.005–1.030)
Urobilinogen, UA: 0.2 mg/dL (ref 0.0–1.0)
pH: 6.5 (ref 5.0–8.0)

## 2014-07-26 LAB — CBC WITH DIFFERENTIAL/PLATELET
Basophils Absolute: 0 10*3/uL (ref 0.0–0.1)
Basophils Relative: 0 % (ref 0–1)
EOS ABS: 0.1 10*3/uL (ref 0.0–0.7)
Eosinophils Relative: 1 % (ref 0–5)
HCT: 38.6 % (ref 36.0–46.0)
Hemoglobin: 13.3 g/dL (ref 12.0–15.0)
LYMPHS PCT: 32 % (ref 12–46)
Lymphs Abs: 3.1 10*3/uL (ref 0.7–4.0)
MCH: 31.3 pg (ref 26.0–34.0)
MCHC: 34.5 g/dL (ref 30.0–36.0)
MCV: 90.8 fL (ref 78.0–100.0)
Monocytes Absolute: 0.6 10*3/uL (ref 0.1–1.0)
Monocytes Relative: 6 % (ref 3–12)
NEUTROS PCT: 61 % (ref 43–77)
Neutro Abs: 5.9 10*3/uL (ref 1.7–7.7)
Platelets: 243 10*3/uL (ref 150–400)
RBC: 4.25 MIL/uL (ref 3.87–5.11)
RDW: 12.3 % (ref 11.5–15.5)
WBC: 9.6 10*3/uL (ref 4.0–10.5)

## 2014-07-26 LAB — COMPREHENSIVE METABOLIC PANEL
ALK PHOS: 60 U/L (ref 39–117)
ALT: 25 U/L (ref 0–35)
AST: 24 U/L (ref 0–37)
Albumin: 4.1 g/dL (ref 3.5–5.2)
Anion gap: 7 (ref 5–15)
BUN: 10 mg/dL (ref 6–23)
CALCIUM: 9.1 mg/dL (ref 8.4–10.5)
CO2: 27 mmol/L (ref 19–32)
Chloride: 107 mEq/L (ref 96–112)
Creatinine, Ser: 0.85 mg/dL (ref 0.50–1.10)
GFR calc Af Amer: >90 mL/min (ref >90)
Glucose, Bld: 122 mg/dL — ABNORMAL HIGH (ref 70–99)
Potassium: 3.6 mmol/L (ref 3.5–5.1)
Sodium: 141 mmol/L (ref 135–145)
Total Bilirubin: 0.7 mg/dL (ref 0.3–1.2)
Total Protein: 6.8 g/dL (ref 6.0–8.3)

## 2014-07-26 LAB — POC URINE PREG, ED: Preg Test, Ur: NEGATIVE

## 2014-07-26 LAB — LIPASE, BLOOD: Lipase: 20 U/L (ref 11–59)

## 2014-07-26 MED ORDER — HYDROCODONE-ACETAMINOPHEN 5-325 MG PO TABS: 1.0000 | ORAL_TABLET | Freq: Four times a day (QID) | ORAL | Status: DC | PRN

## 2014-07-26 MED ORDER — ONDANSETRON 8 MG PO TBDP
8.0000 mg | ORAL_TABLET | Freq: Once | ORAL | Status: AC
  Administered 2014-07-26: 8 mg via ORAL
  Filled 2014-07-26: qty 1

## 2014-07-26 MED ORDER — GI COCKTAIL ~~LOC~~
30.0000 mL | Freq: Once | ORAL | Status: AC
  Administered 2014-07-26: 30 mL via ORAL
  Filled 2014-07-26: qty 30

## 2014-07-26 MED ORDER — OMEPRAZOLE 20 MG PO CPDR: 20.0000 mg | DELAYED_RELEASE_CAPSULE | Freq: Every day | ORAL | Status: DC

## 2014-07-26 NOTE — ED Notes (Addendum)
Pt from home c/o generalized abdominal pain and nausea since drinking alcohol on New years. No emesis in last 24 hours and one episode of diarrhea.

## 2014-07-26 NOTE — ED Notes (Signed)
Pt ambulated to restroom with steady gait.

## 2014-07-26 NOTE — Discharge Instructions (Signed)
Prilosec as prescribed. Hydrocodone as prescribed as needed for pain.  Return to the emergency department if you develop a significant worsening of your symptoms.   Abdominal Pain, Women Abdominal (stomach, pelvic, or belly) pain can be caused by many things. It is important to tell your doctor:  The location of the pain.  Does it come and go or is it present all the time?  Are there things that start the pain (eating certain foods, exercise)?  Are there other symptoms associated with the pain (fever, nausea, vomiting, diarrhea)? All of this is helpful to know when trying to find the cause of the pain. CAUSES   Stomach: virus or bacteria infection, or ulcer.  Intestine: appendicitis (inflamed appendix), regional ileitis (Crohn's disease), ulcerative colitis (inflamed colon), irritable bowel syndrome, diverticulitis (inflamed diverticulum of the colon), or cancer of the stomach or intestine.  Gallbladder disease or stones in the gallbladder.  Kidney disease, kidney stones, or infection.  Pancreas infection or cancer.  Fibromyalgia (pain disorder).  Diseases of the female organs:  Uterus: fibroid (non-cancerous) tumors or infection.  Fallopian tubes: infection or tubal pregnancy.  Ovary: cysts or tumors.  Pelvic adhesions (scar tissue).  Endometriosis (uterus lining tissue growing in the pelvis and on the pelvic organs).  Pelvic congestion syndrome (female organs filling up with blood just before the menstrual period).  Pain with the menstrual period.  Pain with ovulation (producing an egg).  Pain with an IUD (intrauterine device, birth control) in the uterus.  Cancer of the female organs.  Functional pain (pain not caused by a disease, may improve without treatment).  Psychological pain.  Depression. DIAGNOSIS  Your doctor will decide the seriousness of your pain by doing an examination.  Blood tests.  X-rays.  Ultrasound.  CT scan (computed  tomography, special type of X-ray).  MRI (magnetic resonance imaging).  Cultures, for infection.  Barium enema (dye inserted in the large intestine, to better view it with X-rays).  Colonoscopy (looking in intestine with a lighted tube).  Laparoscopy (minor surgery, looking in abdomen with a lighted tube).  Major abdominal exploratory surgery (looking in abdomen with a large incision). TREATMENT  The treatment will depend on the cause of the pain.   Many cases can be observed and treated at home.  Over-the-counter medicines recommended by your caregiver.  Prescription medicine.  Antibiotics, for infection.  Birth control pills, for painful periods or for ovulation pain.  Hormone treatment, for endometriosis.  Nerve blocking injections.  Physical therapy.  Antidepressants.  Counseling with a psychologist or psychiatrist.  Minor or major surgery. HOME CARE INSTRUCTIONS   Do not take laxatives, unless directed by your caregiver.  Take over-the-counter pain medicine only if ordered by your caregiver. Do not take aspirin because it can cause an upset stomach or bleeding.  Try a clear liquid diet (broth or water) as ordered by your caregiver. Slowly move to a bland diet, as tolerated, if the pain is related to the stomach or intestine.  Have a thermometer and take your temperature several times a day, and record it.  Bed rest and sleep, if it helps the pain.  Avoid sexual intercourse, if it causes pain.  Avoid stressful situations.  Keep your follow-up appointments and tests, as your caregiver orders.  If the pain does not go away with medicine or surgery, you may try:  Acupuncture.  Relaxation exercises (yoga, meditation).  Group therapy.  Counseling. SEEK MEDICAL CARE IF:   You notice certain foods cause stomach pain.  Your home care treatment is not helping your pain.  You need stronger pain medicine.  You want your IUD removed.  You feel faint  or lightheaded.  You develop nausea and vomiting.  You develop a rash.  You are having side effects or an allergy to your medicine. SEEK IMMEDIATE MEDICAL CARE IF:   Your pain does not go away or gets worse.  You have a fever.  Your pain is felt only in portions of the abdomen. The right side could possibly be appendicitis. The left lower portion of the abdomen could be colitis or diverticulitis.  You are passing blood in your stools (bright red or black tarry stools, with or without vomiting).  You have blood in your urine.  You develop chills, with or without a fever.  You pass out. MAKE SURE YOU:   Understand these instructions.  Will watch your condition.  Will get help right away if you are not doing well or get worse. Document Released: 05/05/2007 Document Revised: 11/22/2013 Document Reviewed: 05/25/2009 [facility] Patient Information 2015 [place], [place]. This information is not intended to replace advice given to you by your health care provider. Make sure you discuss any questions you have with your health care provider.

## 2014-07-26 NOTE — ED Provider Notes (Signed)
CSN: [id number]     Arrival date & time 07/26/14  1249 History   First MD Initiated Contact with Patient 07/26/14 1340     Chief Complaint  Patient presents with  . Abdominal Pain     (Consider location/radiation/quality/duration/timing/severity/associated sxs/prior Treatment) HPI Comments: Patient is a 19 year old female with no significant past medical history. She presents with complaints of epigastric burning that has been ongoing for the past 5 days. She states that on New Year's Eve she drank multiple alcoholic beverages which he does not normally do. Her stomach has been burning since this time. She denies any fevers or chills. She denies any bloody vomit or stool. She denies any diarrhea.  Patient is a 19 y.o. female presenting with abdominal pain. The history is provided by the patient.  Abdominal Pain Pain location:  Epigastric Pain quality: burning   Pain radiates to:  Does not radiate Pain severity:  Moderate Onset quality:  Sudden Duration:  5 days Timing:  Constant Progression:  Worsening Chronicity:  New Context: alcohol use   Relieved by:  Nothing Worsened by:  Nothing tried Ineffective treatments:  None tried   Past Medical History  Diagnosis Date  . Diverticulitis   . Sickle cell trait    Past Surgical History  Procedure Laterality Date  . Tonsillectomy     No family history on file. History  Substance Use Topics  . Smoking status: Current Every Day Smoker -- 0.50 packs/day    Types: Cigarettes  . Smokeless tobacco: Not on file  . Alcohol Use: Yes     Comment: socially   OB History    No data available     Review of Systems  Gastrointestinal: Positive for abdominal pain.  All other systems reviewed and are negative.     Allergies  Review of patient's allergies indicates no known allergies.  Home Medications   Prior to Admission medications   Medication Sig Start Date End Date Taking? Authorizing Provider  OVER THE COUNTER MEDICATION  Take 2 tablets by mouth daily. TOJI - hair supplement   Yes Historical Provider, MD  loperamide (IMODIUM) 2 MG capsule Take two tabs po initially, then one tab after each loose stool: max 8 tabs in 24 hours Patient not taking: Reported on 05/28/2014 04/09/14   Hurman HornJohn M Bednar, MD   BP 127/72 mmHg  Pulse 66  Temp(Src) 97.5 F (36.4 C) (Oral)  Resp 17  SpO2 100%  LMP 06/30/2014 Physical Exam  Constitutional: She is oriented to person, place, and time. She appears well-developed and well-nourished. No distress.  HENT:  Head: Normocephalic and atraumatic.  Neck: Normal range of motion. Neck supple.  Cardiovascular: Normal rate and regular rhythm.  Exam reveals no gallop and no friction rub.   No murmur heard. Pulmonary/Chest: Effort normal and breath sounds normal. No respiratory distress. She has no wheezes.  Abdominal: Soft. Bowel sounds are normal. She exhibits no distension. There is tenderness.  There is mild tenderness to palpation in the epigastric region with no rebound and no guarding.  Musculoskeletal: Normal range of motion.  Neurological: She is alert and oriented to person, place, and time.  Skin: Skin is warm and dry. She is not diaphoretic.  Nursing note and vitals reviewed.   ED Course  Procedures (including critical care time) Labs Review Labs Reviewed  COMPREHENSIVE METABOLIC PANEL - Abnormal; Notable for the following:    Glucose, Bld 122 (*)    All other components within normal limits  CBC WITH DIFFERENTIAL  LIPASE, BLOOD  URINALYSIS, ROUTINE W REFLEX MICROSCOPIC  POC URINE PREG, ED    Imaging Review No results found.   EKG Interpretation None      MDM   Final diagnoses:  None    Patient feeling better with GI cocktail. I suspect she has an alcohol-related gastritis and will recommend Prilosec and when necessary return. Her laboratory studies are all unremarkable and I doubt a gallbladder or other pathology. Her lipase is normal thus ruling out  pancreatitis.    Geoffery Lyons, MD 07/26/14 [phone]

## 2014-10-09 ENCOUNTER — Emergency Department (HOSPITAL_COMMUNITY)
Admission: EM | Admit: 2014-10-09 | Discharge: 2014-10-10 | Disposition: A | Discharge status: Home / Self Care | Payer: Medicaid Other | Attending: Emergency Medicine | Admitting: Emergency Medicine

## 2014-10-09 ENCOUNTER — Encounter (HOSPITAL_COMMUNITY): Payer: Self-pay | Admitting: *Deleted

## 2014-10-09 DIAGNOSIS — Z3202 Encounter for pregnancy test, result negative: Secondary | ICD-10-CM | POA: Insufficient documentation

## 2014-10-09 DIAGNOSIS — Z79899 Other long term (current) drug therapy: Secondary | ICD-10-CM | POA: Insufficient documentation

## 2014-10-09 DIAGNOSIS — Z8719 Personal history of other diseases of the digestive system: Secondary | ICD-10-CM | POA: Insufficient documentation

## 2014-10-09 DIAGNOSIS — Z720 Tobacco use: Secondary | ICD-10-CM | POA: Insufficient documentation

## 2014-10-09 DIAGNOSIS — Z862 Personal history of diseases of the blood and blood-forming organs and certain disorders involving the immune mechanism: Secondary | ICD-10-CM | POA: Insufficient documentation

## 2014-10-09 DIAGNOSIS — A64 Unspecified sexually transmitted disease: Secondary | ICD-10-CM | POA: Insufficient documentation

## 2014-10-09 MED ORDER — CEFTRIAXONE SODIUM 250 MG IJ SOLR
250.0000 mg | Freq: Once | INTRAMUSCULAR | Status: AC
  Administered 2014-10-10: 250 mg via INTRAMUSCULAR
  Filled 2014-10-09: qty 250

## 2014-10-09 MED ORDER — AZITHROMYCIN 250 MG PO TABS
1000.0000 mg | ORAL_TABLET | Freq: Once | ORAL | Status: AC
  Administered 2014-10-10: 1000 mg via ORAL
  Filled 2014-10-09: qty 4

## 2014-10-09 MED ORDER — LIDOCAINE HCL (PF) 1 % IJ SOLN: 0.9000 mL | Freq: Once | INTRAMUSCULAR | Status: DC

## 2014-10-09 NOTE — ED Provider Notes (Signed)
CSN: [id number]     Arrival date & time 10/09/14  2239 History  This chart was scribed for Pamela Crumble, MD by Tanda Rockers, ED Scribe. This patient was seen in room A02C/A02C and the patient's care was started at 10:58 PM.   Chief Complaint  Patient presents with  . Exposure to STD   The history is provided by the patient. No language interpreter was used.     HPI Comments: Pamela Patterson is a 19 y.o. female who presents to the Emergency Department complaining of exposure to STDs. Pt reports her boyfriend got checked today for STDs and was positive for gonorrhea. Pt admits to having unprotected sex approximately 4 hours ago with her boyfriend. She complains of hematuria and lower abdominal pain. She denies fever, nausea, vomiting, or any other symptoms.    Past Medical History  Diagnosis Date  . Diverticulitis   . Sickle cell trait    Past Surgical History  Procedure Laterality Date  . Tonsillectomy     History reviewed. No pertinent family history. History  Substance Use Topics  . Smoking status: Current Every Day Smoker -- 0.50 packs/day    Types: Cigarettes  . Smokeless tobacco: Not on file  . Alcohol Use: Yes     Comment: socially   OB History    No data available     Review of Systems  10 Systems reviewed and all are negative for acute change except as noted in the HPI.    Allergies  Review of patient's allergies indicates no known allergies.  Home Medications   Prior to Admission medications   Medication Sig Start Date End Date Taking? Authorizing Provider  HYDROcodone-acetaminophen (NORCO) 5-325 MG per tablet Take 1-2 tablets by mouth every 6 (six) hours as needed. 07/26/14   Geoffery Lyons, MD  loperamide (IMODIUM) 2 MG capsule Take two tabs po initially, then one tab after each loose stool: max 8 tabs in 24 hours Patient not taking: Reported on 05/28/2014 04/09/14   Wayland Salinas, MD  omeprazole (PRILOSEC) 20 MG capsule Take 1 capsule (20 mg total) by mouth daily.  07/26/14   Geoffery Lyons, MD  OVER THE COUNTER MEDICATION Take 2 tablets by mouth daily. TOJI - hair supplement    Historical Provider, MD   Triage Vitals: BP 143/91 mmHg  Pulse 100  Temp(Src) 98.1 F (36.7 C) (Oral)  Resp 20  Wt 170 lb (77.111 kg)  SpO2 100%  LMP 09/02/2014   Physical Exam  Constitutional: She is oriented to person, place, and time. She appears well-developed and well-nourished.  Tearful and anxious on exam.   HENT:  Head: Normocephalic and atraumatic.  Nose: Nose normal.  Mouth/Throat: Oropharynx is clear and moist. No oropharyngeal exudate.  Eyes: Conjunctivae and EOM are normal. Pupils are equal, round, and reactive to light. No scleral icterus.  Neck: Normal range of motion. Neck supple. No JVD present. No tracheal deviation present. No thyromegaly present.  Cardiovascular: Normal rate, regular rhythm and normal heart sounds.  Exam reveals no gallop and no friction rub.   No murmur heard. Pulmonary/Chest: Effort normal and breath sounds normal. No respiratory distress. She has no wheezes. She exhibits no tenderness.  Abdominal: Soft. Bowel sounds are normal. She exhibits no distension and no mass. There is no tenderness. There is no rebound and no guarding.  Genitourinary: Vagina normal and uterus normal. No vaginal discharge found.  Mild amount of blood seen in vaginal vault. No abnormalities to the cervix seen. No CMT, no  adnexal tenderness.  Musculoskeletal: Normal range of motion. She exhibits no edema or tenderness.  Lymphadenopathy:    She has no cervical adenopathy.  Neurological: She is alert and oriented to person, place, and time. No cranial nerve deficit. She exhibits normal muscle tone.  Skin: Skin is warm and dry. No rash noted. No erythema. No pallor.  Nursing note and vitals reviewed.   ED Course  Procedures (including critical care time)  DIAGNOSTIC STUDIES: Oxygen Saturation is 100% on RA, normal by my interpretation.    COORDINATION OF  CARE: 11:01 PM-Discussed treatment plan which includes pelvic exam, UA, Urine pregnancy with pt at bedside and pt agreed to plan.   Labs Review Labs Reviewed  WET PREP, GENITAL - Abnormal; Notable for the following:    WBC, Wet Prep HPF POC RARE (*)    All other components within normal limits  URINALYSIS, ROUTINE W REFLEX MICROSCOPIC - Abnormal; Notable for the following:    Hgb urine dipstick SMALL (*)    All other components within normal limits  URINE MICROSCOPIC-ADD ON  HIV ANTIBODY (ROUTINE TESTING)  POC URINE PREG, ED  GC/CHLAMYDIA PROBE AMP ([place])    Imaging Review No results found.   EKG Interpretation None      MDM   Final diagnoses:  None   Patient presents to the emergency department for STD evaluation. She states her boyfriend was diagnosed with a sexually-transmitted disease and is requesting treatment. Patient will be treated in the emergency department, she has lower abdominal pain as well, thus pelvic exam will be performed  Exam findings are above. Patient states she may be beginning her menstrual cycle, this is likely the cause of her bleeding. This does not reveal any infection or pregnancy. Her vital signs remain within her normal limits and she is safe for discharge.   I personally performed the services described in this documentation, which was scribed in my presence. The recorded information has been reviewed and is accurate.      Pamela CrumbleAdeleke Aristotle Lieb, MD 10/10/14 [phone]

## 2014-10-09 NOTE — ED Notes (Signed)
Pt in stating that her boyfriend called her and told her that he has an STD and to come in and get checked, pt denies any symptoms

## 2014-10-09 NOTE — Discharge Instructions (Signed)
Sexually Transmitted Infection Pamela Patterson, you were treated for infections and will be called if it is positive.  Follow-up with a primary care physician within 3 days for continued management and also for repeat check of her high blood pressure. If symptoms worsen come back to emergency department immediately. Thank you. A sexually transmitted disease (STD) is a disease or infection often passed to another person during sex. However, STDs can be passed through nonsexual ways. An STD can be passed through:  Spit (saliva).  Semen.  Blood.  Mucus from the vagina.  Pee (urine). HOW CAN I LESSEN MY CHANCES OF GETTING AN STD?  Use:  Latex condoms.  Water-soluble lubricants with condoms. Do not use petroleum jelly or oils.  Dental dams. These are small pieces of latex that are used as a barrier during oral sex.  Avoid having more than one sex partner.  Do not have sex with someone who has other sex partners.  Do not have sex with anyone you do not know or who is at high risk for an STD.  Avoid risky sex that can break your skin.  Do not have sex if you have open sores on your mouth or skin.  Avoid drinking too much alcohol or taking illegal drugs. Alcohol and drugs can affect your good judgment.  Avoid oral and anal sex acts.  Get shots (vaccines) for HPV and hepatitis.  If you are at risk of being infected with HIV, it is advised that you take a certain medicine daily to prevent HIV infection. This is called pre-exposure prophylaxis (PrEP). You may be at risk if:  You are a man who has sex with other men (MSM).  You are attracted to the opposite sex (heterosexual) and are having sex with more than one partner.  You take drugs with a needle.  You have sex with someone who has HIV.  Talk with your doctor about if you are at high risk of being infected with HIV. If you begin to take PrEP, get tested for HIV first. Get tested every 3 months for as long as you are taking  PrEP. WHAT SHOULD I DO IF I THINK I HAVE AN STD?  See your doctor.  Tell your sex partner(s) that you have an STD. They should be tested and treated.  Do not have sex until your doctor says it is okay. WHEN SHOULD I GET HELP? Get help right away if:  You have bad belly (abdominal) pain.  You are a man and have puffiness (swelling) or pain in your testicles.  You are a woman and have puffiness in your vagina. Document Released: 08/15/2004 Document Revised: 07/13/2013 Document Reviewed: 01/01/2013 [facility] Patient Information 2015 [place], [place]. This information is not intended to replace advice given to you by your health care provider. Make sure you discuss any questions you have with your health care provider. Hypertension Hypertension is another name for high blood pressure. High blood pressure forces your heart to work harder to pump blood. A blood pressure reading has two numbers, which includes a higher number over a lower number (example: 110/72). HOME CARE   Have your blood pressure rechecked by your doctor.  Only take medicine as told by your doctor. Follow the directions carefully. The medicine does not work as well if you skip doses. Skipping doses also puts you at risk for problems.  Do not smoke.  Monitor your blood pressure at home as told by your doctor. GET HELP IF:  You think  you are having a reaction to the medicine you are taking.  You have repeat headaches or feel dizzy.  You have puffiness (swelling) in your ankles.  You have trouble with your vision. GET HELP RIGHT AWAY IF:   You get a very bad headache and are confused.  You feel weak, numb, or faint.  You get chest or belly (abdominal) pain.  You throw up (vomit).  You cannot breathe very well. MAKE SURE YOU:   Understand these instructions.  Will watch your condition.  Will get help right away if you are not doing well or get worse. Document Released: 12/25/2007 Document Revised:  07/13/2013 Document Reviewed: 04/30/2013 [facility] Patient Information 2015 [place], [place]. This information is not intended to replace advice given to you by your health care provider. Make sure you discuss any questions you have with your health care provider.

## 2014-10-10 LAB — URINALYSIS, ROUTINE W REFLEX MICROSCOPIC
Bilirubin Urine: NEGATIVE
Glucose, UA: NEGATIVE mg/dL
Ketones, ur: NEGATIVE mg/dL
Leukocytes, UA: NEGATIVE
NITRITE: NEGATIVE
Protein, ur: NEGATIVE mg/dL
Specific Gravity, Urine: 1.013 (ref 1.005–1.030)
UROBILINOGEN UA: 0.2 mg/dL (ref 0.0–1.0)
pH: 6.5 (ref 5.0–8.0)

## 2014-10-10 LAB — WET PREP, GENITAL
Clue Cells Wet Prep HPF POC: NONE SEEN
TRICH WET PREP: NONE SEEN
YEAST WET PREP: NONE SEEN

## 2014-10-10 LAB — URINE MICROSCOPIC-ADD ON

## 2014-10-10 LAB — GC/CHLAMYDIA PROBE AMP (~~LOC~~) NOT AT ARMC
Chlamydia: NEGATIVE
NEISSERIA GONORRHEA: POSITIVE — AB

## 2014-10-10 LAB — HIV ANTIBODY (ROUTINE TESTING W REFLEX): HIV Screen 4th Generation wRfx: NONREACTIVE

## 2014-10-10 LAB — POC URINE PREG, ED: Preg Test, Ur: NEGATIVE

## 2014-10-10 MED ORDER — ONDANSETRON 4 MG PO TBDP
8.0000 mg | ORAL_TABLET | Freq: Once | ORAL | Status: AC
  Administered 2014-10-10: 8 mg via ORAL
  Filled 2014-10-10: qty 2

## 2014-10-10 MED ORDER — AZITHROMYCIN 250 MG PO TABS
1000.0000 mg | ORAL_TABLET | Freq: Once | ORAL | Status: AC
  Administered 2014-10-10: 1000 mg via ORAL
  Filled 2014-10-10: qty 4

## 2014-10-10 MED ORDER — LIDOCAINE HCL (PF) 1 % IJ SOLN
0.9000 mL | Freq: Once | INTRAMUSCULAR | Status: AC
  Administered 2014-10-10: 0.9 mL via INTRADERMAL

## 2014-10-12 ENCOUNTER — Telehealth (HOSPITAL_COMMUNITY): Payer: Self-pay

## 2014-10-12 NOTE — ED Notes (Signed)
Positive for gonorrhea. Treated per protocol. Attempting to contact pt.  

## 2014-10-13 ENCOUNTER — Telehealth (HOSPITAL_BASED_OUTPATIENT_CLINIC_OR_DEPARTMENT_OTHER): Payer: Self-pay | Admitting: *Deleted

## 2015-01-01 ENCOUNTER — Encounter (HOSPITAL_COMMUNITY): Payer: Self-pay

## 2015-01-01 ENCOUNTER — Emergency Department (HOSPITAL_COMMUNITY)
Admission: EM | Admit: 2015-01-01 | Discharge: 2015-01-01 | Disposition: A | Discharge status: Home / Self Care | Payer: Medicaid Other | Attending: Emergency Medicine | Admitting: Emergency Medicine

## 2015-01-01 DIAGNOSIS — T7840XA Allergy, unspecified, initial encounter: Secondary | ICD-10-CM

## 2015-01-01 DIAGNOSIS — Z79899 Other long term (current) drug therapy: Secondary | ICD-10-CM | POA: Insufficient documentation

## 2015-01-01 DIAGNOSIS — X58XXXA Exposure to other specified factors, initial encounter: Secondary | ICD-10-CM | POA: Insufficient documentation

## 2015-01-01 DIAGNOSIS — Z862 Personal history of diseases of the blood and blood-forming organs and certain disorders involving the immune mechanism: Secondary | ICD-10-CM | POA: Insufficient documentation

## 2015-01-01 DIAGNOSIS — Z3202 Encounter for pregnancy test, result negative: Secondary | ICD-10-CM | POA: Insufficient documentation

## 2015-01-01 DIAGNOSIS — R112 Nausea with vomiting, unspecified: Secondary | ICD-10-CM | POA: Insufficient documentation

## 2015-01-01 DIAGNOSIS — Z8719 Personal history of other diseases of the digestive system: Secondary | ICD-10-CM | POA: Insufficient documentation

## 2015-01-01 DIAGNOSIS — Y9389 Activity, other specified: Secondary | ICD-10-CM | POA: Insufficient documentation

## 2015-01-01 DIAGNOSIS — Y92511 Restaurant or cafe as the place of occurrence of the external cause: Secondary | ICD-10-CM | POA: Insufficient documentation

## 2015-01-01 DIAGNOSIS — R109 Unspecified abdominal pain: Secondary | ICD-10-CM | POA: Insufficient documentation

## 2015-01-01 DIAGNOSIS — R21 Rash and other nonspecific skin eruption: Secondary | ICD-10-CM | POA: Insufficient documentation

## 2015-01-01 DIAGNOSIS — Y998 Other external cause status: Secondary | ICD-10-CM | POA: Insufficient documentation

## 2015-01-01 LAB — I-STAT BETA HCG BLOOD, ED (MC, WL, AP ONLY): I-stat hCG, quantitative: <5 m[IU]/mL (ref <5)

## 2015-01-01 MED ORDER — DIPHENHYDRAMINE HCL 50 MG/ML IJ SOLN: 25.0000 mg | Freq: Once | INTRAMUSCULAR | Status: DC

## 2015-01-01 MED ORDER — FAMOTIDINE 20 MG PO TABS
20.0000 mg | ORAL_TABLET | Freq: Once | ORAL | Status: AC
  Administered 2015-01-01: 20 mg via ORAL
  Filled 2015-01-01: qty 1

## 2015-01-01 MED ORDER — ONDANSETRON HCL 4 MG/2ML IJ SOLN: 4.0000 mg | Freq: Once | INTRAMUSCULAR | Status: DC

## 2015-01-01 MED ORDER — METHYLPREDNISOLONE SODIUM SUCC 125 MG IJ SOLR
125.0000 mg | Freq: Once | INTRAMUSCULAR | Status: AC
  Administered 2015-01-01: 125 mg via INTRAVENOUS
  Filled 2015-01-01: qty 2

## 2015-01-01 MED ORDER — DIPHENHYDRAMINE HCL 25 MG PO TABS: 25.0000 mg | ORAL_TABLET | Freq: Four times a day (QID) | ORAL | Status: DC

## 2015-01-01 MED ORDER — PREDNISONE 10 MG PO TABS: ORAL_TABLET | ORAL | Status: DC

## 2015-01-01 MED ORDER — ONDANSETRON HCL 4 MG PO TABS
4.0000 mg | ORAL_TABLET | Freq: Once | ORAL | Status: AC
  Administered 2015-01-01: 4 mg via ORAL
  Filled 2015-01-01: qty 1

## 2015-01-01 MED ORDER — DIPHENHYDRAMINE HCL 25 MG PO CAPS
25.0000 mg | ORAL_CAPSULE | Freq: Once | ORAL | Status: AC
  Administered 2015-01-01: 25 mg via ORAL
  Filled 2015-01-01: qty 1

## 2015-01-01 MED ORDER — FAMOTIDINE IN NACL 20-0.9 MG/50ML-% IV SOLN: 20.0000 mg | Freq: Once | INTRAVENOUS | Status: DC

## 2015-01-01 MED ORDER — FAMOTIDINE 20 MG PO TABS: 20.0000 mg | ORAL_TABLET | Freq: Two times a day (BID) | ORAL | Status: DC

## 2015-01-01 NOTE — ED Provider Notes (Signed)
CSN: [id number]     Arrival date & time 01/01/15  1356 History   First MD Initiated Contact with Patient 01/01/15 1520     Chief Complaint  Patient presents with  . Allergic Reaction     (Consider location/radiation/quality/duration/timing/severity/associated sxs/prior Treatment) HPI Pamela Patterson is a 19 y.o. female with hx of diverticulitis, presents to ED complaining of swelling and rash to the face, nausea, vomiting. Pt states symptoms began while eating hibachi from a restaurant. States she was eating shrimp and chicken. States she has had it in the past with no problems. Shortly after starting to eat, she developed diffuse abdominal cramping and vomiting. She states she vomited about 5 times. She denies diarrhea. States she felt like her face was swelling up and develop a red rash. She states she stopped bleeding immediately, and came to emergency department. This happened 2 hours ago. States symptoms are improving now. She denies any swelling to her lips, tongue, throat. No difficulty swallowing or breathing. She denies any rash to any other parts of the body. She denies any nausea. She denies any history of anaphylaxis or similar reactions in the past. She denies any new products or any other foods prior to the onset of reaction. No medications taken prior to coming in  Past Medical History  Diagnosis Date  . Diverticulitis   . Sickle cell trait    Past Surgical History  Procedure Laterality Date  . Tonsillectomy     History reviewed. No pertinent family history. History  Substance Use Topics  . Smoking status: Never Smoker   . Smokeless tobacco: Not on file  . Alcohol Use: Yes     Comment: socially   OB History    No data available     Review of Systems  Constitutional: Negative for fever and chills.  HENT: Positive for facial swelling.   Respiratory: Negative for cough, chest tightness and shortness of breath.   Cardiovascular: Negative for chest pain, palpitations and  leg swelling.  Gastrointestinal: Positive for nausea, vomiting and abdominal pain. Negative for diarrhea.  Genitourinary: Negative for dysuria and flank pain.  Musculoskeletal: Negative for myalgias, arthralgias, neck pain and neck stiffness.  Skin: Positive for rash.  Neurological: Negative for dizziness, weakness and headaches.  All other systems reviewed and are negative.     Allergies  Review of patient's allergies indicates no known allergies.  Home Medications   Prior to Admission medications   Medication Sig Start Date End Date Taking? Authorizing Provider  HYDROcodone-acetaminophen (NORCO) 5-325 MG per tablet Take 1-2 tablets by mouth every 6 (six) hours as needed. Patient not taking: Reported on 10/09/2014 07/26/14   Geoffery Lyons, MD  loperamide (IMODIUM) 2 MG capsule Take two tabs po initially, then one tab after each loose stool: max 8 tabs in 24 hours Patient not taking: Reported on 05/28/2014 04/09/14   Wayland Salinas, MD  omeprazole (PRILOSEC) 20 MG capsule Take 1 capsule (20 mg total) by mouth daily. Patient not taking: Reported on 10/09/2014 07/26/14   Geoffery Lyons, MD  OVER THE COUNTER MEDICATION Take 2 tablets by mouth daily. TOJI - hair supplement    Historical Provider, MD   BP 125/66 mmHg  Pulse 89  Temp(Src) 98.6 F (37 C) (Oral)  Resp 16  Ht  (1.626 m)  SpO2 98%  LMP 12/06/2014 Physical Exam  Constitutional: She is oriented to person, place, and time. She appears well-developed and well-nourished. No distress.  HENT:  Head: Normocephalic.  Left Ear: External  ear normal.  Nose: Nose normal.  Mouth/Throat: Oropharynx is clear and moist.  Ear canals and TMs are normal bilaterally. Normal lips, tongue, uvula with no swelling. No oral mucosal rash.  Eyes: Conjunctivae are normal.  Neck: Normal range of motion. Neck supple.  Cardiovascular: Normal rate, regular rhythm and normal heart sounds.   Pulmonary/Chest: Effort normal and breath sounds normal. No  respiratory distress. She has no wheezes. She has no rales.  Abdominal: Soft. Bowel sounds are normal. She exhibits no distension. There is no tenderness. There is no rebound and no guarding.  Musculoskeletal: She exhibits no edema.  Neurological: She is alert and oriented to person, place, and time.  Skin: Skin is warm and dry.  Erythematous macular papular rash to entire face. No obvious swelling of the eyes or lips.  Psychiatric: She has a normal mood and affect. Her behavior is normal.  Nursing note and vitals reviewed.   ED Course  Procedures (including critical care time) Labs Review Labs Reviewed - No data to display  Imaging Review No results found.   EKG Interpretation None      MDM   Final diagnoses:  Allergic reaction, initial encounter   Pt with possible allergic reaction after eating hibachi chicken and shrimp. Pt is in NAD. VS normal. No swelling of tongue, lips, throat. Will start solumedrol, benadryl, pepcid. Will monitor.   5:18 PM Pt monitored for 3.5 hours. She is improving. Continues to have rash but no other symptoms. Drinking fluids in ED. Stable for d/c home. Home with short prednisone taper, benadryl, pepcid.   Filed Vitals:   01/01/15 1422 01/01/15 1644  BP: 125/66 152/97  Pulse: 89 74  Temp: 98.6 F (37 C)   TempSrc: Oral   Resp: 16 18  Height: 5\' 4"  (1.626 m)   SpO2: 98% 99%     Jaynie Crumble, PA-C 01/01/15 2338  Purvis Sheffield, MD 01/02/15 1214

## 2015-01-01 NOTE — Discharge Instructions (Signed)
Take Benadryl and Pepcid daily for the next 3 days. Prednisone taper as prescribed until all gone. Avoid shrimp or hibachi in the near future. Follow up with your doctor as needed. Return if worsening rash, swelling, difficulty breathing, any new concerning symptoms.   Food Allergy A food allergy occurs from eating something you are sensitive to. Food allergies occur in all age groups. It may be passed to you from your parents (heredity).  CAUSES  Some common causes are cow's milk, seafood, eggs, nuts (including peanut butter), wheat, and soybeans. SYMPTOMS  Common problems are:   Swelling around the mouth.  An itchy, red rash.  Hives.  Vomiting.  Diarrhea. Severe allergic reactions are life-threatening. This reaction is called anaphylaxis. It can cause the mouth and throat to swell. This makes it hard to breathe and swallow. In severe reactions, only a small amount of food may be fatal within seconds. HOME CARE INSTRUCTIONS   If you are unsure what caused the reaction, keep a diary of foods eaten and symptoms that followed. Avoid foods that cause reactions.  If hives or rash are present:  Take medicines as directed.  Use an over-the-counter antihistamine (diphenhydramine) to treat hives and itching as needed.  Apply cold compresses to the skin or take baths in cool water. Avoid hot baths or showers. These will increase the redness and itching.  If you are severely allergic:  Hospitalization is often required following a severe reaction.  Wear a medical alert bracelet or necklace that describes the allergy.  Carry your anaphylaxis kit or epinephrine injection with you at all times. Both you and your family members should know how to use this. This can be lifesaving if you have a severe reaction. If epinephrine is used, it is important for you to seek immediate medical care or call your local emergency services (911 in [place] When the epinephrine wears off, it can be followed by  a delayed reaction, which can be fatal.  Replace your epinephrine immediately after use in case of another reaction.  Ask your caregiver for instructions if you have not been taught how to use an epinephrine injection.  Do not drive until medicines used to treat the reaction have worn off, unless approved by your caregiver. SEEK MEDICAL CARE IF:   You suspect a food allergy. Symptoms generally happen within 30 minutes of eating a food.  Your symptoms have not gone away within 2 days. See your caregiver sooner if symptoms are getting worse.  You develop new symptoms.  You want to retest yourself with a food or drink you think causes an allergic reaction. Never do this if an anaphylactic reaction to that food or drink has happened before.  There is a return of the symptoms which brought you to your caregiver. SEEK IMMEDIATE MEDICAL CARE IF:   You have trouble breathing, are wheezing, or you have a tight feeling in your chest or throat.  You have a swollen mouth, or you have hives, swelling, or itching all over your body. Use your epinephrine injection immediately. This is given into the outside of your thigh, deep into the muscle. Following use of the epinephrine injection, seek help right away. Seek immediate medical care or call your local emergency services (911 in [place] MAKE SURE YOU:   Understand these instructions.  Will watch your condition.  Will get help right away if you are not doing well or get worse. Document Released: 07/05/2000 Document Revised: 09/30/2011 Document Reviewed: 02/25/2008 Exit[employer] Patient Information 2015  [employer]. This information is not intended to replace advice given to you by your health care provider. Make sure you discuss any questions you have with your health care provider. ° °

## 2015-01-01 NOTE — ED Notes (Signed)
PA at bedside.

## 2015-01-01 NOTE — ED Notes (Signed)
Onset 1:45p pt was eating hibachi chicken and shrimp and started get rash on face, swelling to eyes, headache and stomach ache.  No lip, tongue or throat swelling.  No shortness of breath.  Pt has ate this food in the past with no problems.

## 2015-08-03 ENCOUNTER — Emergency Department (HOSPITAL_COMMUNITY)
Admission: EM | Admit: 2015-08-03 | Discharge: 2015-08-03 | Discharge status: Against Medical Advice | Payer: Medicaid Other | Attending: Emergency Medicine | Admitting: Emergency Medicine

## 2015-08-03 ENCOUNTER — Encounter (HOSPITAL_COMMUNITY): Payer: Self-pay

## 2015-08-03 DIAGNOSIS — R1030 Lower abdominal pain, unspecified: Secondary | ICD-10-CM | POA: Insufficient documentation

## 2015-08-03 LAB — COMPREHENSIVE METABOLIC PANEL
ALBUMIN: 3.9 g/dL (ref 3.5–5.0)
ALT: 16 U/L (ref 14–54)
AST: 20 U/L (ref 15–41)
Alkaline Phosphatase: 62 U/L (ref 38–126)
Anion gap: 13 (ref 5–15)
BUN: 11 mg/dL (ref 6–20)
CALCIUM: 9 mg/dL (ref 8.9–10.3)
CHLORIDE: 104 mmol/L (ref 101–111)
CO2: 22 mmol/L (ref 22–32)
CREATININE: 0.72 mg/dL (ref 0.44–1.00)
GFR calc Af Amer: >60 mL/min (ref >60)
GFR calc non Af Amer: >60 mL/min (ref >60)
GLUCOSE: 83 mg/dL (ref 65–99)
Potassium: 3.9 mmol/L (ref 3.5–5.1)
SODIUM: 139 mmol/L (ref 135–145)
Total Bilirubin: 1.1 mg/dL (ref 0.3–1.2)
Total Protein: 6.4 g/dL — ABNORMAL LOW (ref 6.5–8.1)

## 2015-08-03 LAB — URINALYSIS, ROUTINE W REFLEX MICROSCOPIC
BILIRUBIN URINE: NEGATIVE
Glucose, UA: NEGATIVE mg/dL
HGB URINE DIPSTICK: NEGATIVE
Ketones, ur: NEGATIVE mg/dL
Leukocytes, UA: NEGATIVE
Nitrite: NEGATIVE
Protein, ur: NEGATIVE mg/dL
Specific Gravity, Urine: 1.008 (ref 1.005–1.030)
pH: 6.5 (ref 5.0–8.0)

## 2015-08-03 LAB — CBC
HCT: 36.8 % (ref 36.0–46.0)
HEMOGLOBIN: 12.5 g/dL (ref 12.0–15.0)
MCH: 31.6 pg (ref 26.0–34.0)
MCHC: 34 g/dL (ref 30.0–36.0)
MCV: 93.2 fL (ref 78.0–100.0)
Platelets: 229 10*3/uL (ref 150–400)
RBC: 3.95 MIL/uL (ref 3.87–5.11)
RDW: 12.1 % (ref 11.5–15.5)
WBC: 7.2 10*3/uL (ref 4.0–10.5)

## 2015-08-03 LAB — LIPASE, BLOOD: LIPASE: 14 U/L (ref 11–51)

## 2015-08-03 NOTE — ED Notes (Signed)
Pt presents with c/o abdominal pain that initially started approx 3 days ago. Pt reports that Pt reports that the pain is in the lower part of her abdomen in the center. Pt denies any N/V/D, reports that her urine is cloudy.

## 2015-08-07 ENCOUNTER — Emergency Department (HOSPITAL_COMMUNITY)
Admission: EM | Admit: 2015-08-07 | Discharge: 2015-08-07 | Disposition: A | Discharge status: Home / Self Care | Payer: Medicaid Other | Attending: Emergency Medicine | Admitting: Emergency Medicine

## 2015-08-07 ENCOUNTER — Encounter (HOSPITAL_COMMUNITY): Payer: Self-pay | Admitting: Emergency Medicine

## 2015-08-07 DIAGNOSIS — Z8719 Personal history of other diseases of the digestive system: Secondary | ICD-10-CM | POA: Insufficient documentation

## 2015-08-07 DIAGNOSIS — Z79899 Other long term (current) drug therapy: Secondary | ICD-10-CM | POA: Insufficient documentation

## 2015-08-07 DIAGNOSIS — Z862 Personal history of diseases of the blood and blood-forming organs and certain disorders involving the immune mechanism: Secondary | ICD-10-CM | POA: Insufficient documentation

## 2015-08-07 DIAGNOSIS — N390 Urinary tract infection, site not specified: Secondary | ICD-10-CM

## 2015-08-07 DIAGNOSIS — Z3202 Encounter for pregnancy test, result negative: Secondary | ICD-10-CM | POA: Insufficient documentation

## 2015-08-07 LAB — URINE MICROSCOPIC-ADD ON

## 2015-08-07 LAB — URINALYSIS, ROUTINE W REFLEX MICROSCOPIC
Bilirubin Urine: NEGATIVE
GLUCOSE, UA: NEGATIVE mg/dL
HGB URINE DIPSTICK: NEGATIVE
Ketones, ur: NEGATIVE mg/dL
Nitrite: NEGATIVE
PH: 6 (ref 5.0–8.0)
Protein, ur: NEGATIVE mg/dL
Specific Gravity, Urine: 1.014 (ref 1.005–1.030)

## 2015-08-07 LAB — POC URINE PREG, ED: Preg Test, Ur: NEGATIVE

## 2015-08-07 MED ORDER — NITROFURANTOIN MONOHYD MACRO 100 MG PO CAPS: 100.0000 mg | ORAL_CAPSULE | Freq: Two times a day (BID) | ORAL | Status: DC

## 2015-08-07 MED ORDER — PHENAZOPYRIDINE HCL 200 MG PO TABS: 200.0000 mg | ORAL_TABLET | Freq: Three times a day (TID) | ORAL | Status: DC

## 2015-08-07 NOTE — ED Notes (Signed)
Pt reports urinary frequency and urine cloudiness onset a week ago; denies other GU symptoms or vaginal complaint.

## 2015-08-07 NOTE — ED Provider Notes (Signed)
CSN: [id number]     Arrival date & time 08/07/15  1323 History   First MD Initiated Contact with Patient 08/07/15 1742     Chief Complaint  Patient presents with  . Urinary Frequency     (Consider location/radiation/quality/duration/timing/severity/associated sxs/prior Treatment) HPI  20 year old female presents about 5 days of lower abdominal pain, urinary frequency, and cloudy urine. Denies dysuria. No fevers, vomiting, nausea, back pain. Denies any vaginal discharge or bleeding. Pain is coming and going. Does not radiate. No change in bowel habits.  Past Medical History  Diagnosis Date  . Diverticulitis   . Sickle cell trait [facility](HCC)    Past Surgical History  Procedure Laterality Date  . Tonsillectomy     No family history on file. Social History  Substance Use Topics  . Smoking status: Never Smoker   . Smokeless tobacco: None  . Alcohol Use: Yes     Comment: socially   OB History    No data available     Review of Systems  Constitutional: Negative for fever.  Gastrointestinal: Positive for abdominal pain. Negative for nausea, vomiting, diarrhea and constipation.  Genitourinary: Positive for urgency and frequency. Negative for dysuria, vaginal bleeding and vaginal discharge.  Musculoskeletal: Negative for back pain.  All other systems reviewed and are negative.     Allergies  Review of patient's allergies indicates no known allergies.  Home Medications   Prior to Admission medications   Medication Sig Start Date End Date Taking? Authorizing Provider  guaiFENesin (MUCINEX) 600 MG 12 hr tablet Take 600 mg by mouth daily as needed for cough.   Yes Historical Provider, MD  Multiple Vitamin (MULTIVITAMIN WITH MINERALS) TABS tablet Take 2 tablets by mouth daily.   Yes Historical Provider, MD  famotidine (PEPCID) 20 MG tablet Take 1 tablet (20 mg total) by mouth 2 (two) times daily. 01/01/15   Tatyana Kirichenko, PA-C  HYDROcodone-acetaminophen (NORCO) 5-325 MG per  tablet Take 1-2 tablets by mouth every 6 (six) hours as needed. Patient not taking: Reported on 10/09/2014 07/26/14   Geoffery Lyonsouglas Delo, MD   BP 130/82 mmHg  Pulse 61  Temp(Src) 97.8 F (36.6 C) (Oral)  Resp 18  Ht 5\' 3"  (1.6 m)  Wt 186 lb (84.369 kg)  BMI 32.96 kg/m2  SpO2 100%  LMP 07/13/2015 (Approximate) Physical Exam  Constitutional: She is oriented to person, place, and time. She appears well-developed and well-nourished.  HENT:  Head: Normocephalic and atraumatic.  Right Ear: External ear normal.  Left Ear: External ear normal.  Nose: Nose normal.  Eyes: Right eye exhibits no discharge. Left eye exhibits no discharge.  Cardiovascular: Normal rate, regular rhythm and normal heart sounds.   Pulmonary/Chest: Effort normal and breath sounds normal.  Abdominal: Soft. There is tenderness in the suprapubic area. There is no CVA tenderness.  Neurological: She is alert and oriented to person, place, and time.  Skin: Skin is warm and dry.  Nursing note and vitals reviewed.   ED Course  Procedures (including critical care time) Labs Review Labs Reviewed  URINALYSIS, ROUTINE W REFLEX MICROSCOPIC (NOT AT [facility]) - Abnormal; Notable for the following:    APPearance CLOUDY (*)    Leukocytes, UA MODERATE (*)    All other components within normal limits  URINE MICROSCOPIC-ADD ON - Abnormal; Notable for the following:    Squamous Epithelial / LPF 6-30 (*)    Bacteria, UA FEW (*)    All other components within normal limits  URINE CULTURE  POC URINE PREG,  ED    Imaging Review No results found. I have personally reviewed and evaluated these images and lab results as part of my medical decision-making.   EKG Interpretation None      MDM   Final diagnoses:  UTI (lower urinary tract infection)    Patient's urine has a contaminated sample but her history and exam are most consistent with a UTI. Given that she is symptomatic I do not feel a repeat urinalysis would be indicated, will  treat with Macrobid, and if not better or getting worse she will need reevaluation by another physician. Advised of strict return precautions. No signs or symptoms of more severe disease such as pyelonephritis. Highly doubt other acute cause of abdominal pain such as appendicitis, obstruction, TOA, torsion, etc.    Pricilla Loveless, MD 08/07/15 2326

## 2015-08-09 LAB — URINE CULTURE

## 2015-08-25 ENCOUNTER — Encounter (HOSPITAL_COMMUNITY): Payer: Self-pay | Admitting: Family Medicine

## 2015-08-25 ENCOUNTER — Emergency Department (HOSPITAL_COMMUNITY)
Admission: EM | Admit: 2015-08-25 | Discharge: 2015-08-25 | Disposition: A | Discharge status: Home / Self Care | Payer: Medicaid Other | Attending: Emergency Medicine | Admitting: Emergency Medicine

## 2015-08-25 DIAGNOSIS — Z8719 Personal history of other diseases of the digestive system: Secondary | ICD-10-CM | POA: Insufficient documentation

## 2015-08-25 DIAGNOSIS — Z862 Personal history of diseases of the blood and blood-forming organs and certain disorders involving the immune mechanism: Secondary | ICD-10-CM | POA: Insufficient documentation

## 2015-08-25 DIAGNOSIS — R110 Nausea: Secondary | ICD-10-CM | POA: Insufficient documentation

## 2015-08-25 DIAGNOSIS — R630 Anorexia: Secondary | ICD-10-CM | POA: Insufficient documentation

## 2015-08-25 DIAGNOSIS — Z3202 Encounter for pregnancy test, result negative: Secondary | ICD-10-CM | POA: Insufficient documentation

## 2015-08-25 DIAGNOSIS — Z202 Contact with and (suspected) exposure to infections with a predominantly sexual mode of transmission: Secondary | ICD-10-CM | POA: Insufficient documentation

## 2015-08-25 DIAGNOSIS — N898 Other specified noninflammatory disorders of vagina: Secondary | ICD-10-CM | POA: Insufficient documentation

## 2015-08-25 LAB — URINALYSIS, ROUTINE W REFLEX MICROSCOPIC
Bilirubin Urine: NEGATIVE
GLUCOSE, UA: NEGATIVE mg/dL
Hgb urine dipstick: NEGATIVE
Ketones, ur: NEGATIVE mg/dL
Leukocytes, UA: NEGATIVE
NITRITE: NEGATIVE
PH: 6 (ref 5.0–8.0)
PROTEIN: NEGATIVE mg/dL
Specific Gravity, Urine: 1.011 (ref 1.005–1.030)

## 2015-08-25 LAB — POC URINE PREG, ED: Preg Test, Ur: NEGATIVE

## 2015-08-25 MED ORDER — AZITHROMYCIN 250 MG PO TABS
1000.0000 mg | ORAL_TABLET | Freq: Once | ORAL | Status: AC
  Administered 2015-08-25: 1000 mg via ORAL
  Filled 2015-08-25: qty 4

## 2015-08-25 MED ORDER — LIDOCAINE HCL (PF) 1 % IJ SOLN
INTRAMUSCULAR | Status: AC
  Administered 2015-08-25: 5 mL
  Filled 2015-08-25: qty 5

## 2015-08-25 MED ORDER — CEFTRIAXONE SODIUM 250 MG IJ SOLR
250.0000 mg | Freq: Once | INTRAMUSCULAR | Status: AC
  Administered 2015-08-25: 250 mg via INTRAMUSCULAR
  Filled 2015-08-25: qty 250

## 2015-08-25 NOTE — Discharge Instructions (Signed)
If you were given medicines take as directed.  If you are on coumadin or contraceptives realize their levels and effectiveness is altered by many different medicines.  If you have any reaction (rash, tongues swelling, other) to the medicines stop taking and see a physician.    If your blood pressure was elevated in the ER make sure you follow up for management with a primary doctor or return for chest pain, shortness of breath or stroke symptoms.  Please follow up as directed and return to the ER or see a physician for new or worsening symptoms.  Thank you. Filed Vitals:   08/25/15 1402 08/25/15 1732  BP: 133/75 123/82  Pulse: 84 98  Temp: 97.5 F (36.4 C)   TempSrc: Oral   Resp: 16 16  Weight: 167 lb 4.8 oz (75.887 kg)   SpO2: 100% 100%

## 2015-08-25 NOTE — ED Notes (Signed)
Pt verbalized understanding of d/c instructions, prescriptions, and follow-up care. No further questions/concerns, VSS, ambulatory w/ steady gait (refused wheelchair) 

## 2015-08-25 NOTE — ED Provider Notes (Signed)
CSN: [id number]     Arrival date & time 08/25/15  1335 History   First MD Initiated Contact with Patient 08/25/15 1646     Chief Complaint  Patient presents with  . Exposure to STD     (Consider location/radiation/quality/duration/timing/severity/associated sxs/prior Treatment) HPI Comments: 20 year old female with no significant medical history presents concern for STD exposure from unfaithful boyfriend. Patient's had mild nausea and lower cramping no focal abdominal pain no fevers or chills. No STD history. Patient would like HIV testing. Last sexual intercourse in January.  Patient is a 20 y.o. female presenting with STD exposure. The history is provided by the patient.  Exposure to STD Pertinent negatives include no chest pain, no abdominal pain, no headaches and no shortness of breath.    Past Medical History  Diagnosis Date  . Diverticulitis   . Sickle cell trait [facility])    Past Surgical History  Procedure Laterality Date  . Tonsillectomy     History reviewed. No pertinent family history. Social History  Substance Use Topics  . Smoking status: Never Smoker   . Smokeless tobacco: None  . Alcohol Use: Yes     Comment: socially   OB History    No data available     Review of Systems  Constitutional: Positive for appetite change. Negative for fever and chills.  HENT: Negative for congestion.   Eyes: Negative for visual disturbance.  Respiratory: Negative for shortness of breath.   Cardiovascular: Negative for chest pain.  Gastrointestinal: Negative for vomiting and abdominal pain.  Genitourinary: Positive for vaginal discharge. Negative for dysuria, flank pain and vaginal bleeding.  Musculoskeletal: Negative for back pain, neck pain and neck stiffness.  Skin: Negative for rash.  Neurological: Negative for light-headedness and headaches.      Allergies  Review of patient's allergies indicates no known allergies.  Home Medications   Prior to Admission  medications   Not on File   BP 123/82 mmHg  Pulse 98  Temp(Src) 97.5 F (36.4 C) (Oral)  Resp 16  Wt 167 lb 4.8 oz (75.887 kg)  SpO2 100%  LMP 08/17/2015 Physical Exam  Constitutional: She is oriented to person, place, and time. She appears well-developed and well-nourished.  HENT:  Head: Normocephalic and atraumatic.  Eyes: Conjunctivae are normal. Right eye exhibits no discharge. Left eye exhibits no discharge.  Neck: Normal range of motion. Neck supple. No tracheal deviation present.  Cardiovascular: Normal rate.   Pulmonary/Chest: Effort normal.  Abdominal: Soft. She exhibits no distension. There is no tenderness. There is no guarding.  Genitourinary:  Mild white discharge no cervical motion tenderness.  Musculoskeletal: She exhibits no edema.  Neurological: She is alert and oriented to person, place, and time.  Skin: Skin is warm. No rash noted.  Psychiatric: She has a normal mood and affect.  Nursing note and vitals reviewed.   ED Course  Procedures (including critical care time) Labs Review Labs Reviewed  URINALYSIS, ROUTINE W REFLEX MICROSCOPIC (NOT AT [facility])  RPR  HIV ANTIBODY (ROUTINE TESTING)  POC URINE PREG, ED  GC/CHLAMYDIA PROBE AMP ([place]) NOT AT [facility]    Imaging Review No results found. I have personally reviewed and evaluated these images and lab results as part of my medical decision-making.   EKG Interpretation None      MDM   Final diagnoses:  STD exposure   STD exposure well-appearing no abdominal pain or fever on exam. No signs of PID. Antibiotics given and follow-up with women's clinic. HIV testing  pending.  Results and differential diagnosis were discussed with the patient/parent/guardian. Xrays were independently reviewed by myself.  Close follow up outpatient was discussed, comfortable with the plan.   Medications  cefTRIAXone (ROCEPHIN) injection 250 mg (250 mg Intramuscular Given 08/25/15 1732)  azithromycin (ZITHROMAX)  tablet 1,000 mg (1,000 mg Oral Given 08/25/15 1731)  lidocaine (PF) (XYLOCAINE) 1 % injection (5 mLs  Given 08/25/15 1732)    Filed Vitals:   08/25/15 1402 08/25/15 1732  BP: 133/75 123/82  Pulse: 84 98  Temp: 97.5 F (36.4 C)   TempSrc: Oral   Resp: 16 16  Weight: 167 lb 4.8 oz (75.887 kg)   SpO2: 100% 100%    Final diagnoses:  STD exposure       Blane Ohara, MD 08/25/15 1800

## 2015-08-25 NOTE — ED Notes (Signed)
Pt here stating she wants an STD check because her boyfriends was unfaithful. sts some abd cramping and diarrhea.

## 2015-08-26 LAB — HIV ANTIBODY (ROUTINE TESTING W REFLEX): HIV Screen 4th Generation wRfx: NONREACTIVE

## 2015-08-26 LAB — RPR: RPR Ser Ql: NONREACTIVE

## 2015-08-28 LAB — GC/CHLAMYDIA PROBE AMP (~~LOC~~) NOT AT ARMC
CHLAMYDIA, DNA PROBE: NEGATIVE
Neisseria Gonorrhea: NEGATIVE

## 2015-10-20 ENCOUNTER — Encounter (HOSPITAL_COMMUNITY): Payer: Self-pay | Admitting: Emergency Medicine

## 2015-10-20 ENCOUNTER — Emergency Department (HOSPITAL_COMMUNITY)
Admission: EM | Admit: 2015-10-20 | Discharge: 2015-10-20 | Disposition: A | Discharge status: Home / Self Care | Payer: Medicaid Other | Attending: Emergency Medicine | Admitting: Emergency Medicine

## 2015-10-20 DIAGNOSIS — N926 Irregular menstruation, unspecified: Secondary | ICD-10-CM | POA: Insufficient documentation

## 2015-10-20 DIAGNOSIS — Z862 Personal history of diseases of the blood and blood-forming organs and certain disorders involving the immune mechanism: Secondary | ICD-10-CM | POA: Insufficient documentation

## 2015-10-20 DIAGNOSIS — Z3202 Encounter for pregnancy test, result negative: Secondary | ICD-10-CM | POA: Insufficient documentation

## 2015-10-20 DIAGNOSIS — Z8719 Personal history of other diseases of the digestive system: Secondary | ICD-10-CM | POA: Insufficient documentation

## 2015-10-20 LAB — POC URINE PREG, ED: Preg Test, Ur: NEGATIVE

## 2015-10-20 NOTE — ED Notes (Signed)
RN called mini lab inquiring about results of urine preg test; Roxanne, phleb reports testing resulted as NEG; RN requested phleb look into why results did not cross over into patients medical record

## 2015-10-20 NOTE — Discharge Instructions (Signed)
You have been seen today for a late period. Your lab tests showed no abnormalities. Follow up with OBGYN by going to the [facility] as soon as possible for reevaluation and chronic care. Follow up with PCP as needed. Return to ED should symptoms worsen.  RESOURCE GUIDE  Chronic Pain Problems: Contact [facility]  [phone] Patients need to be referred by their primary care doctor.  Insufficient Money for Medicine: Contact United Way:  call "211" or [facility] [phone].  No Primary Care Doctor: - Call Health Connect  [phone] - can help you locate a primary care doctor that  accepts your insurance, provides certain services, etc. - Physician Referral Service- [phone]  Agencies that provide inexpensive medical care: - [facility]  [phone] - [facility]  [phone] - [facility]  [phone] - Women's Clinic  [phone] - Planned Parenthood  [phone] [facility]  [phone]  Medicaid-accepting [facility] Providers: - [facility]- [address]  [phone], Mon-Fri 9am-7pm, Sat 9am-1pm - [facility]- [address] [place], [address] [place]  [phone] - [facility]- [address], [address] [place]  [phone] [facility]- [address]  [phone] - Renaye Rakers- [address] [place], [address], [phone]  Only accepts [place] patients after they have their name  applied to their card  Self Pay (no insurance) in [place]: - Sickle Cell Patients: Dr Willey Blade, [facility]  [address] [place], [phone] - [facility]- [address] [place]  [phone]       [facility] [place]- [address]       -     [facility]- see information above (Speak to [employer] if you do not have insurance)       -  [facility]- [address]  [place], [phone]       -  [facility]- [address] [place] [address],  [phone]       -  [facility]- [address] [phone]       -  Dr Julio Sicks-  [address], [place], [phone]       -  [facility]- [address] [phone]       -  [facility]- [address] [phone], also [address], [phone]       -    [facility]- [address] [place], [phone], 1st & 3rd Saturday   every month, 10am-1pm  1) Find a Doctor and Pay Out of Pocket Although you won't have to find out who is covered by your insurance plan, it is a good idea to ask around and get recommendations. You will then need to call the office and see if the doctor you have chosen will accept you as a new patient and what types of options they offer for patients who are self-pay. Some doctors offer discounts or will set up payment plans for their patients who do not have insurance, but you will need to ask so you aren't surprised when you get to your appointment.  2) Contact Your Local Health Department Not all health departments have doctors that can see patients for sick visits, but many do, so  it is worth a call to see if yours does. If you don't know where your local health department is, you can check in your phone book. The CDC also has a tool to help you locate your state's health department, and many state websites also have listings of all of their local health departments.  3) Find a Walk-in Clinic If your illness is not likely to be very severe or complicated, you may want to try a walk in clinic. These are popping up all over the country in pharmacies, drugstores, and shopping centers. They're usually staffed by nurse practitioners or physician assistants that have been trained to treat common illnesses and complaints. They're usually fairly quick and inexpensive. However, if you have serious medical issues or chronic medical problems, these  are probably not your best option  STD Testing - [facility] of [facility] [place], STD Clinic, [address], [place], phone [phone] or [phone].  Monday - Friday, call for an appointment. [facility]- [place] Department of [facility] [place], STD Clinic, [place], [place], phone [phone] or [phone].  Monday - Friday, call for an appointment.  Abuse/Neglect: [facility]- [place] Child Abuse Hotline (208)385-0683(336) 321-564-7875 [facility]- [place] Child Abuse Hotline 971-732-7718912-450-5073 (After Hours)  Emergency Shelter:  [facility] 2315527396(336) [phone]  Maternity Homes: - [facility] [facility] [phone](336) [phone] - [facility] [phone]) [phone]  MRSA Hotline #:   904-547-7634657-563-3501  [facility] Resources  [facility] [place]  United Way [facility] [address]                 [address]         [address]  [place]                                               [place]                              [place] Phone:  [phone]                                  Phone:  [phone]                   Phone:  [phone]  [facility], [phone] - [facility] Services - [phone]- [phone]       -     [facility] in [place], [address],                                  [phone], [facility]  [place] Child Abuse Hotline 206-617-7719(336) 732-236-6441 or (608) 803-8183(336) [phone] (After Hours)   Behavioral Health Services  Substance Abuse Resources: - [facility] Drug Services  5071587147([phone] [phone] - [facility] [phone] - [facility] ([phone] Floydene Flock- [facility] (519)[phone] - Residential & Outpatient Substance Abuse Program  [phone]  Psychological Services: Tressie Ellis- [facility]  [phone] - [facility]  [phone] - [facility], [phone] [place] [address], [place], ACCESS  LINE: (605) [phone] or (579) [phone], [email or web address]  Dental  Assistance  If unable to pay or uninsured, contact:  [facility] or [facility]. to become qualified for the adult dental clinic.  Patients with Medicaid: [facility] [phone] [address], [phone] [address] [phone]  If unable to pay, or uninsured, contact [facility] [phone]) or [facility] Department [phone] in [place], [phone] in [facility]) to become qualified for the adult dental clinic   Other Low-Cost Community Dental Services: - [facility]- [address] [place], [place], [place], [address], [phone], Ext. 123, 2nd and 4th Thursday of the month at 6:30am.  10 clients each day by appointment, can sometimes see walk-in patients if someone does not show for an appointment. [facility]- [address] [place], [place], [address], [phone] - [facility]- [address], [place], [place], [address], [phone] - [facility]- [phone] [facility] Health Department- [phone] [facility] Department- [phone]

## 2015-10-20 NOTE — ED Provider Notes (Signed)
CSN: [id number]     Arrival date & time 10/20/15  0021 History   First MD Initiated Contact with Patient 10/20/15 [phone]     Chief Complaint  Patient presents with  . Late Period     (Consider location/radiation/quality/duration/timing/severity/associated sxs/prior Treatment) HPI   Pamela Patterson is a 20 y.o. female, patient with no pertinent past medical history, presenting to the ED with a Complaint of a late period. Patient states that she should have started about a week ago. LMP was February 28. Patient states that she is sexually active and does not use any form of birth control. Patient denies previous pregnancies. Patient further denies abdominal pain, vaginal discharge, fever/chills, or any other complaints. Patient states that she took a home pregnancy test and it was negative.  Past Medical History  Diagnosis Date  . Diverticulitis   . Sickle cell trait [facility](HCC)    Past Surgical History  Procedure Laterality Date  . Tonsillectomy     History reviewed. No pertinent family history. Social History  Substance Use Topics  . Smoking status: Never Smoker   . Smokeless tobacco: None  . Alcohol Use: Yes     Comment: socially   OB History    No data available     Review of Systems  Constitutional: Negative for fever, chills and diaphoresis.  Genitourinary: Positive for menstrual problem. Negative for dysuria, vaginal bleeding, vaginal discharge and pelvic pain.       Late period      Allergies  Review of patient's allergies indicates no known allergies.  Home Medications   Prior to Admission medications   Not on File   BP 144/91 mmHg  Pulse 87  Temp(Src) 98 F (36.7 C) (Oral)  Resp 16  SpO2 99%  LMP 09/16/2015 Physical Exam  Constitutional: She appears well-developed and well-nourished. No distress.  HENT:  Head: Normocephalic and atraumatic.  Eyes: Conjunctivae are normal.  Cardiovascular: Normal rate and regular rhythm.   Pulmonary/Chest: Effort normal.   Abdominal: Soft. There is no tenderness.  Neurological: She is alert.  Skin: Skin is warm and dry. She is not diaphoretic.  Nursing note and vitals reviewed.   ED Course  Procedures (including critical care time) Labs Review Labs Reviewed  POC URINE PREG, ED    I have personally reviewed and evaluated these lab results as part of my medical decision-making.   EKG Interpretation None      MDM   Final diagnoses:  Menstrual period late    Pamela Patterson presents for her period being a week late.  This patient has no other symptoms other than a delayed menstrual cycle. No indication for pelvic exam or imaging. Urine pregnancy negative. Results were communicated with the patient. Patient to follow-up with OB/GYN. Patient given information for the outpatient women's clinic. Patient voiced understanding of these instructions and is comfortable with discharge.    Anselm PancoastShawn C Joy, PA-C 10/20/15 0109  Gilda Creasehristopher J Pollina, MD 10/20/15 [phone]

## 2015-10-20 NOTE — ED Notes (Signed)
Pt presents for a late menstrual cycle- late 7 days; pt denies birth control use and unprotected sex with female partner; reports usually 1-2 days late but never this late

## 2015-10-29 ENCOUNTER — Inpatient Hospital Stay (HOSPITAL_COMMUNITY)
Admission: AD | Admit: 2015-10-29 | Discharge: 2015-10-29 | Disposition: A | Discharge status: Home / Self Care | Payer: Medicaid Other | Source: Ambulatory Visit | Attending: Obstetrics & Gynecology | Admitting: Obstetrics & Gynecology

## 2015-10-29 ENCOUNTER — Encounter (HOSPITAL_COMMUNITY): Payer: Self-pay | Admitting: Advanced Practice Midwife

## 2015-10-29 DIAGNOSIS — N911 Secondary amenorrhea: Secondary | ICD-10-CM | POA: Insufficient documentation

## 2015-10-29 DIAGNOSIS — D573 Sickle-cell trait: Secondary | ICD-10-CM | POA: Insufficient documentation

## 2015-10-29 DIAGNOSIS — Z3202 Encounter for pregnancy test, result negative: Secondary | ICD-10-CM | POA: Insufficient documentation

## 2015-10-29 LAB — POCT PREGNANCY, URINE: PREG TEST UR: NEGATIVE

## 2015-10-29 NOTE — Discharge Instructions (Signed)
Secondary Amenorrhea Secondary amenorrhea is the stopping of menstrual flow for 3-6 months in a female who has previously had periods. There are many possible causes. Most of these causes are not serious. Usually, treating the underlying problem causing the loss of menses will return your periods to normal. CAUSES  Some common and uncommon causes of not menstruating include:  Malnutrition.  Low blood sugar (hypoglycemia).  Polycystic ovary disease.  Stress or fear.  Breastfeeding.  Hormone imbalance.  Ovarian failure.  Medicines.  Extreme obesity.  Cystic fibrosis.  Low body weight or drastic weight reduction from any cause.  Early menopause.  Removal of ovaries or uterus.  Contraceptives.  Illness.  Long-term (chronic) illnesses.  Cushing syndrome.  Thyroid problems.  Birth control pills, patches, or vaginal rings for birth control. RISK FACTORS You may be at greater risk of secondary amenorrhea if:  You have a family history of this condition.  You have an eating disorder.  You do athletic training. DIAGNOSIS  A diagnosis is made by your health care provider taking a medical history and doing a physical exam. This will include a pelvic exam to check for problems with your reproductive organs. Pregnancy must be ruled out. Often, numerous blood tests are done to measure different hormones in the body. Urine testing may be done. Specialized exams (ultrasound, CT scan, MRI, or hysteroscopy) may have to be done as well as measuring the body mass index (BMI). TREATMENT  Treatment depends on the cause of the amenorrhea. If an eating disorder is present, this can be treated with an adequate diet and therapy. Chronic illnesses may improve with treatment of the illness. Amenorrhea may be corrected with medicines, lifestyle changes, or surgery. If the amenorrhea cannot be corrected, it is sometimes possible to create a false menstruation with medicines. HOME CARE  INSTRUCTIONS  Maintain a healthy diet.  Manage weight problems.  Exercise regularly but not excessively.  Get adequate sleep.  Manage stress.  Be aware of changes in your menstrual cycle. Keep a record of when your periods occur. Note the date your period starts, how long it lasts, and any problems. SEEK MEDICAL CARE IF: Your symptoms do not get better with treatment.   This information is not intended to replace advice given to you by your health care provider. Make sure you discuss any questions you have with your health care provider.   Document Released: 08/19/2006 Document Revised: 07/29/2014 Document Reviewed: 12/24/2012 Elsevier Interactive Patient Education 2016 Elsevier Inc.  

## 2015-10-29 NOTE — MAU Provider Note (Signed)
S:  [name] is a 20 y.o. female who present to MAU reporting being 11 days late for period and occassional, mild cramping last few days as if she needs to have a bowel movement. [facility]. Has not retaken pregnancy test.  Was instructed to follow-up [facility], but came to MAU instead. Patient's Patient's last menstrual period was 09/16/2015.Marland Kitchen.  Denies any vaginal bleeding.   O:  Past Medical History  Diagnosis Date  . Diverticulitis   . Sickle cell trait (HCC)     Filed Vitals:   10/29/15 1119  BP: 138/78  Pulse: 82  Temp: 98.1 F (36.7 C)  Resp: 16   General:  A&OX3 with no signs of acute distress. She appears well-developed and well-nourished. No distress.  Pulmonary/Chest: Effort normal. No respiratory distress.  Neurological: She is alert and oriented to person, place, and time.   A: Negative Pregnancy Test Secondary amenorrhea.  P: Discharge home in stable condition. Brief discussion of causes of secondary amenorrhea. Explained secondary amenorrhea is not emergent condition, usually resolves spontaneously within one month and should be managed in the outpatient setting. List of gynecologists given. Follow-up Information    Follow up with Gynecologist of your choice.   Why:  Menstrual problems and other non-emergent gynecology care        Medication List    Notice    You have not been prescribed any medications.     Dorathy KinsmanVirginia Keona Bilyeu, CNM

## 2015-10-29 NOTE — MAU Note (Signed)
Patient went to [facility] ED had negative UPT, still no bleeding, has not taken recent home pregnancy, has pain off and on, no pain today, LMP 09/16/15

## 2015-11-06 ENCOUNTER — Emergency Department (HOSPITAL_COMMUNITY)
Admission: EM | Admit: 2015-11-06 | Discharge: 2015-11-06 | Disposition: A | Discharge status: Home / Self Care | Payer: Self-pay | Attending: Emergency Medicine | Admitting: Emergency Medicine

## 2015-11-06 ENCOUNTER — Other Ambulatory Visit: Payer: Medicaid Other

## 2015-11-06 DIAGNOSIS — Z3200 Encounter for pregnancy test, result unknown: Secondary | ICD-10-CM | POA: Insufficient documentation

## 2015-11-06 NOTE — ED Notes (Signed)
PT.JUST LEFT

## 2015-11-08 ENCOUNTER — Encounter (HOSPITAL_COMMUNITY): Payer: Self-pay | Admitting: Emergency Medicine

## 2015-11-08 ENCOUNTER — Emergency Department (HOSPITAL_COMMUNITY)
Admission: EM | Admit: 2015-11-08 | Discharge: 2015-11-08 | Disposition: A | Discharge status: Home / Self Care | Payer: Self-pay | Attending: Emergency Medicine | Admitting: Emergency Medicine

## 2015-11-08 DIAGNOSIS — N912 Amenorrhea, unspecified: Secondary | ICD-10-CM | POA: Insufficient documentation

## 2015-11-08 DIAGNOSIS — Z8719 Personal history of other diseases of the digestive system: Secondary | ICD-10-CM | POA: Insufficient documentation

## 2015-11-08 DIAGNOSIS — Z3202 Encounter for pregnancy test, result negative: Secondary | ICD-10-CM | POA: Insufficient documentation

## 2015-11-08 DIAGNOSIS — Z862 Personal history of diseases of the blood and blood-forming organs and certain disorders involving the immune mechanism: Secondary | ICD-10-CM | POA: Insufficient documentation

## 2015-11-08 LAB — I-STAT BETA HCG BLOOD, ED (MC, WL, AP ONLY)

## 2015-11-08 NOTE — Discharge Instructions (Signed)
Primary Amenorrhea Primary amenorrhea is the absence of any menstrual flow in a female by the age of 15 years. An average age for the start of menstruation is the age of 12 years. Primary amenorrhea is not considered to have occurred until a female is older than 15 years and has never menstruated. This may occur with or without other signs of puberty. CAUSES  Some common causes of not menstruating include:  Chromosomal abnormality causing the ovaries to malfunction is the most common cause of primary amenorrhea.  Malnutrition.  Low blood sugar (hypoglycemia).  Polycystic ovary syndrome (cysts in the ovaries, not ovulating).  Absence of the vagina, uterus, or ovaries since birth (congenital).  Extreme obesity.  Cystic fibrosis.  Drastic weight loss from any cause.  Over-exercising (running, biking) causing loss of body fat.  Pituitary gland tumor in the brain.  Long-term (chronic) illnesses.  Cushing disease.  Thyroid disease (hypothyroidism, hyperthyroidism).  Part of the brain (hypothalamus) not functioning normally.  Premature ovarian failure. SYMPTOMS  No menstruation by age 15 years in normally developed females is the primary symptom. Other symptoms may include:  Discharge from the breasts.  Hot flashes.  Adult acne.  Facial or chest hair.  Headaches.  Impaired vision.  Recent stress.  Changes in weight, diet, or exercise patterns. DIAGNOSIS  Primary amenorrhea is diagnosed with the help of a medical history and a physical exam. Other tests that may be recommended include:  Blood tests to check for pregnancy, hormonal changes, a bleeding or thyroid disorder, low iron levels (anemia), or other problems.  Urine tests.  Specialized X-ray exams. TREATMENT  Treatment will depend on the cause. For example, some of the causes of primary amenorrhea, such as congenital absence of sex organs, will require surgery to correct. Others may respond to treatment  with medicine. SEEK MEDICAL CARE IF:  There has not been any menstrual flow by age 15 years.  Body maturation does not occur at a level typical of peers.  Pelvic area pain occurs.  There is unusual weight gain or hair growth.   This information is not intended to replace advice given to you by your health care provider. Make sure you discuss any questions you have with your health care provider.   Document Released: 07/08/2005 Document Revised: 07/29/2014 Document Reviewed: 02/17/2013 Elsevier Interactive Patient Education 2016 Elsevier Inc.  

## 2015-11-08 NOTE — ED Provider Notes (Signed)
CSN: [id number]     Arrival date & time 11/08/15  1020 History  By signing my name below, I, Pamela Patterson, attest that this documentation has been prepared under the direction and in the presence of non-physician practitioner, Pamela Horsemanobert Hilliary Jock, PA-C. Electronically Signed: Freida Busmaniana Patterson, Scribe. 11/08/2015. 10:57 AM.    Chief Complaint  Patient presents with  . Possible Pregnancy    The history is provided by the patient. No language interpreter was used.    HPI Comments:  Pamela Patterson is a 20 y.o. female who presents to the Emergency Department requesting a pregnancy test. Pt states her period is ~ 4 weeks late and has never been that late in the past. She denies abdominal pain, vaginal discharge, and fever. Pt notes she has had 2 negative pregnancy tests in the last 3 weeks. Pt has no other acute complaints or symptoms at this time.   No OBGYN No PCP  Past Medical History  Diagnosis Date  . Diverticulitis   . Sickle [facility] trait [facility])    Past Surgical History  Procedure Laterality Date  . Tonsillectomy     History reviewed. No pertinent family history. Social History  Substance Use Topics  . Smoking status: Never Smoker   . Smokeless tobacco: None  . Alcohol Use: Yes     Comment: socially   OB History    No data available     Review of Systems  Constitutional: Negative for fever.  Gastrointestinal: Negative for abdominal pain.  Genitourinary: Negative for vaginal discharge.    Allergies  Review of patient's allergies indicates no known allergies.  Home Medications   Prior to Admission medications   Not on File   BP 136/85 mmHg  Pulse 83  Temp(Src) 97.8 F (36.6 C) (Oral)  SpO2 100%  LMP 09/16/2015 Physical Exam Constitutional: Pt appears well-developed and well-nourished. No distress.  Awake, alert, nontoxic appearance  HENT:  Head: Normocephalic and atraumatic.  Mouth/Throat: Oropharynx is clear and moist. No oropharyngeal exudate.  Eyes: Conjunctivae  are normal. No scleral icterus.  Neck: Normal range of motion. Neck supple.  Cardiovascular: Normal rate, regular rhythm and intact distal pulses.   Pulmonary/Chest: Effort normal and breath sounds normal. No respiratory distress. Pt has no wheezes.  Equal chest expansion  Abdominal: Soft. Bowel sounds are normal.  No focal abdominal tenderness, no RLQ tenderness or pain at McBurney's point, no RUQ tenderness or Murphy's sign, no left-sided abdominal tenderness, no fluid wave, or signs of peritonitis  Musculoskeletal: Normal range of motion. Pt exhibits no edema.  Neurological: Pt is alert.  Speech is clear and goal oriented Moves extremities without ataxia  Skin: Skin is warm and dry. Pt is not diaphoretic.  Psychiatric: Pt has a normal mood and affect.  Nursing note and vitals reviewed.  ED Course  Procedures    DIAGNOSTIC STUDIES:  Oxygen Saturation is 100% on RA, normal by my interpretation.    COORDINATION OF CARE:  10:53 AM Discussed treatment plan with pt at bedside and pt agreed to plan.  Labs Review Labs Reviewed  I-STAT BETA HCG BLOOD, ED ([facility], [facility], [facility] ONLY)      MDM   Final diagnoses:  Amenorrhea    Patient with missed period.  No abdominal pain. No vaginal discharge or dysuria. Vital signs are stable. Recommend primary care/OB/GYN follow-up. HCG negative today. No further workup indicated, as the patient is asymptomatic.  I personally performed the services described in this documentation, which was scribed in my presence. The  recorded information has been reviewed and is accurate.                         Pamela Horseman, PA-C 11/08/15 1205  Pamela Rhine, MD 11/10/15 [phone]

## 2015-11-08 NOTE — ED Notes (Signed)
Patient states she missed a period and wants to be checked.   Denies any problems.

## 2015-11-08 NOTE — ED Notes (Signed)
Pt here for pregnancy test and sts period is late; pt seen x 2 with negative tests

## 2016-02-12 ENCOUNTER — Inpatient Hospital Stay (HOSPITAL_COMMUNITY)
Admission: AD | Admit: 2016-02-12 | Discharge: 2016-02-13 | Disposition: A | Discharge status: Home / Self Care | Payer: Self-pay | Source: Ambulatory Visit | Attending: Family Medicine | Admitting: Family Medicine

## 2016-02-12 ENCOUNTER — Encounter (HOSPITAL_COMMUNITY): Payer: Self-pay | Admitting: *Deleted

## 2016-02-12 DIAGNOSIS — B9689 Other specified bacterial agents as the cause of diseases classified elsewhere: Secondary | ICD-10-CM

## 2016-02-12 DIAGNOSIS — R197 Diarrhea, unspecified: Secondary | ICD-10-CM

## 2016-02-12 DIAGNOSIS — N760 Acute vaginitis: Secondary | ICD-10-CM | POA: Insufficient documentation

## 2016-02-12 DIAGNOSIS — R1032 Left lower quadrant pain: Secondary | ICD-10-CM

## 2016-02-12 DIAGNOSIS — D573 Sickle-cell trait: Secondary | ICD-10-CM | POA: Insufficient documentation

## 2016-02-12 LAB — URINE MICROSCOPIC-ADD ON: RBC / HPF: NONE SEEN RBC/hpf (ref 0–5)

## 2016-02-12 LAB — URINALYSIS, ROUTINE W REFLEX MICROSCOPIC
BILIRUBIN URINE: NEGATIVE
GLUCOSE, UA: NEGATIVE mg/dL
HGB URINE DIPSTICK: NEGATIVE
KETONES UR: NEGATIVE mg/dL
NITRITE: NEGATIVE
PH: 6 (ref 5.0–8.0)
Protein, ur: NEGATIVE mg/dL
SPECIFIC GRAVITY, URINE: 1.01 (ref 1.005–1.030)

## 2016-02-12 LAB — POCT PREGNANCY, URINE: Preg Test, Ur: NEGATIVE

## 2016-02-12 NOTE — MAU Provider Note (Signed)
History     CSN: [id number]  Arrival date and time: 02/12/16 2253   First Provider Initiated Contact with Patient 02/12/16 2345      No chief complaint on file.  HPI  Ms.Pamela Patterson is a 20 y.o. female [id number] non- pregnant female here with diarrhea. On June 17th she started having diarrhea. She has not taken anything for the diarrhea. She has 1-2 episodes of diarrhea every morning and when she eats she feels that everything "goes through her".  Then 2 weeks ago she started having abdominal pain. The pain is located in her lower- upper abdomen. She has not taken anything for the pain. The pain is prior to having a BM and then aches throughout the day.   All of these symptoms started around the time she lost her boyfriend.  She does attests to eating poorly.     OB History    Gravida Para Term Preterm AB Living   1       1     SAB TAB Ectopic Multiple Live Births     1            Past Medical History:  Diagnosis Date  . Diverticulitis   . Sickle cell trait [facility])     Past Surgical History:  Procedure Laterality Date  . TONSILLECTOMY      History reviewed. No pertinent family history.  Social History  Substance Use Topics  . Smoking status: Current Some Day Smoker    Packs/day: 0.00  . Smokeless tobacco: Current User  . Alcohol use Yes     Comment: socially    Allergies: No Known Allergies  No prescriptions prior to admission.   Results for orders placed or performed during the hospital encounter of 02/12/16 (from the past 48 hour(s))  Urinalysis, Routine w reflex microscopic (not at [facility])     Status: Abnormal   Collection Time: 02/12/16 11:02 PM  Result Value Ref Range   Color, Urine YELLOW YELLOW   APPearance CLEAR CLEAR   Specific Gravity, Urine 1.010 1.005 - 1.030   pH 6.0 5.0 - 8.0   Glucose, UA NEGATIVE NEGATIVE mg/dL   Hgb urine dipstick NEGATIVE NEGATIVE   Bilirubin Urine NEGATIVE NEGATIVE   Ketones, ur NEGATIVE NEGATIVE mg/dL   Protein, ur  NEGATIVE NEGATIVE mg/dL   Nitrite NEGATIVE NEGATIVE   Leukocytes, UA SMALL (A) NEGATIVE  Urine microscopic-add on     Status: Abnormal   Collection Time: 02/12/16 11:02 PM  Result Value Ref Range   Squamous Epithelial / LPF 0-5 (A) NONE SEEN   WBC, UA 0-5 0 - 5 WBC/hpf   RBC / HPF NONE SEEN 0 - 5 RBC/hpf   Bacteria, UA FEW (A) NONE SEEN  Pregnancy, urine POC     Status: None   Collection Time: 02/12/16 11:10 PM  Result Value Ref Range   Preg Test, Ur NEGATIVE NEGATIVE    Comment:        THE SENSITIVITY OF THIS METHODOLOGY IS >24 mIU/mL   Wet prep, genital     Status: Abnormal   Collection Time: 02/13/16 12:00 AM  Result Value Ref Range   Yeast Wet Prep HPF POC NONE SEEN NONE SEEN   Trich, Wet Prep NONE SEEN NONE SEEN   Clue Cells Wet Prep HPF POC PRESENT (A) NONE SEEN   WBC, Wet Prep HPF POC FEW (A) NONE SEEN    Comment: MODERATE BACTERIA SEEN   Sperm NONE SEEN     Review  of Systems  Constitutional: Negative for chills and fever.  Gastrointestinal: Positive for abdominal pain and diarrhea. Negative for nausea and vomiting.  Genitourinary: Negative for dysuria and urgency.   Physical Exam   Blood pressure 145/84, pulse 76, temperature 98.9 F (37.2 C), temperature source Oral, resp. rate 16, height 5' 3.5" (1.613 m), weight 176 lb (79.8 kg), SpO2 99 %.  Physical Exam  Constitutional: She is oriented to person, place, and time. She appears well-developed and well-nourished. No distress.  HENT:  Head: Normocephalic.  Eyes: Pupils are equal, round, and reactive to light.  GI: Soft. Normal appearance. There is tenderness in the left lower quadrant. There is no rigidity, no rebound and no guarding.  Genitourinary:  Genitourinary Comments: Wet prep collected without speculum   Musculoskeletal: Normal range of motion.  Neurological: She is alert and oriented to person, place, and time.  Skin: Skin is warm. She is not diaphoretic.  Psychiatric: Her behavior is normal.     MAU Course  Procedures  None  MDM  Wet prep  Diarrhea likely related to diet and stress.   Assessment and Plan   A:  1. Diarrhea, unspecified type   2. LLQ pain   3. BV (bacterial vaginosis)     P:  Discharge home in stable condition  Over the counter imodium as directed on the bottle Probiotics  Discussed the importance of a PCP Return to MAU for emergencies  Rx: Flagyl - no alcohol  Avoid fried, fatty foods. Increase PO fluids.   Duane Lope, NP 02/13/2016 12:47 AM

## 2016-02-12 NOTE — MAU Note (Signed)
Pt reports watery stools 2x per day for the last 4 weeks, lower abd pain for 2 weeks.

## 2016-02-13 DIAGNOSIS — R197 Diarrhea, unspecified: Secondary | ICD-10-CM

## 2016-02-13 LAB — WET PREP, GENITAL
SPERM: NONE SEEN
Trich, Wet Prep: NONE SEEN
YEAST WET PREP: NONE SEEN

## 2016-02-13 MED ORDER — METRONIDAZOLE 500 MG PO TABS: 500.0000 mg | ORAL_TABLET | Freq: Two times a day (BID) | ORAL | 0 refills | Status: DC

## 2016-02-13 NOTE — Discharge Instructions (Signed)
Bacterial Vaginosis Bacterial vaginosis is a vaginal infection that occurs when the normal balance of bacteria in the vagina is disrupted. It results from an overgrowth of certain bacteria. This is the most common vaginal infection in women of childbearing age. Treatment is important to prevent complications, especially in pregnant women, as it can cause a premature delivery. CAUSES  Bacterial vaginosis is caused by an increase in harmful bacteria that are normally present in smaller amounts in the vagina. Several different kinds of bacteria can cause bacterial vaginosis. However, the reason that the condition develops is not fully understood. RISK FACTORS Certain activities or behaviors can put you at an increased risk of developing bacterial vaginosis, including:  Having a new sex partner or multiple sex partners.  Douching.  Using an intrauterine device (IUD) for contraception. Women do not get bacterial vaginosis from toilet seats, bedding, swimming pools, or contact with objects around them. SIGNS AND SYMPTOMS  Some women with bacterial vaginosis have no signs or symptoms. Common symptoms include:  Grey vaginal discharge.  A fishlike odor with discharge, especially after sexual intercourse.  Itching or burning of the vagina and vulva.  Burning or pain with urination. DIAGNOSIS  Your health care provider will take a medical history and examine the vagina for signs of bacterial vaginosis. A sample of vaginal fluid may be taken. Your health care provider will look at this sample under a microscope to check for bacteria and abnormal cells. A vaginal pH test may also be done.  TREATMENT  Bacterial vaginosis may be treated with antibiotic medicines. These may be given in the form of a pill or a vaginal cream. A second round of antibiotics may be prescribed if the condition comes back after treatment. Because bacterial vaginosis increases your risk for sexually transmitted diseases, getting  treated can help reduce your risk for chlamydia, gonorrhea, HIV, and herpes. HOME CARE INSTRUCTIONS   Only take over-the-counter or prescription medicines as directed by your health care provider.  If antibiotic medicine was prescribed, take it as directed. Make sure you finish it even if you start to feel better.  Tell all sexual partners that you have a vaginal infection. They should see their health care provider and be treated if they have problems, such as a mild rash or itching.  During treatment, it is important that you follow these instructions:  Avoid sexual activity or use condoms correctly.  Do not douche.  Avoid alcohol as directed by your health care provider.  Avoid breastfeeding as directed by your health care provider. SEEK MEDICAL CARE IF:   Your symptoms are not improving after 3 days of treatment.  You have increased discharge or pain.  You have a fever. MAKE SURE YOU:   Understand these instructions.  Will watch your condition.  Will get help right away if you are not doing well or get worse. FOR MORE INFORMATION  Centers for Disease Control and Prevention, Division of STD Prevention: [employer].za American Sexual Health Association (ASHA): www.ashastd.org    This information is not intended to replace advice given to you by your health care provider. Make sure you discuss any questions you have with your health care provider.   Document Released: 07/08/2005 Document Revised: 07/29/2014 Document Reviewed: 02/17/2013 Elsevier Interactive Patient Education 2016 Elsevier Inc. Diarrhea Diarrhea is frequent loose and watery bowel movements. It can cause you to feel weak and dehydrated. Dehydration can cause you to become tired and thirsty, have a dry mouth, and have decreased urination that often  is dark yellow. Diarrhea is a sign of another problem, most often an infection that will not last long. In most cases, diarrhea typically lasts 2-3 days. However,  it can last longer if it is a sign of something more serious. It is important to treat your diarrhea as directed by your caregiver to lessen or prevent future episodes of diarrhea. CAUSES  Some common causes include:  Gastrointestinal infections caused by viruses, bacteria, or parasites.  Food poisoning or food allergies.  Certain medicines, such as antibiotics, chemotherapy, and laxatives.  Artificial sweeteners and fructose.  Digestive disorders. HOME CARE INSTRUCTIONS  Ensure adequate fluid intake (hydration): Have 1 cup (8 oz) of fluid for each diarrhea episode. Avoid fluids that contain simple sugars or sports drinks, fruit juices, whole milk products, and sodas. Your urine should be clear or pale yellow if you are drinking enough fluids. Hydrate with an oral rehydration solution that you can purchase at pharmacies, retail stores, and online. You can prepare an oral rehydration solution at home by mixing the following ingredients together:   - tsp table salt.   tsp baking soda.   tsp salt substitute containing potassium chloride.  1  tablespoons sugar.  1 L (34 oz) of water.  Certain foods and beverages may increase the speed at which food moves through the gastrointestinal (GI) tract. These foods and beverages should be avoided and include:  Caffeinated and alcoholic beverages.  High-fiber foods, such as raw fruits and vegetables, nuts, seeds, and whole grain breads and cereals.  Foods and beverages sweetened with sugar alcohols, such as xylitol, sorbitol, and mannitol.  Some foods may be well tolerated and may help thicken stool including:  Starchy foods, such as rice, toast, pasta, low-sugar cereal, oatmeal, grits, baked potatoes, crackers, and bagels.  Bananas.  Applesauce.  Add probiotic-rich foods to help increase healthy bacteria in the GI tract, such as yogurt and fermented milk products.  Wash your hands well after each diarrhea episode.  Only take  over-the-counter or prescription medicines as directed by your caregiver.  Take a warm bath to relieve any burning or pain from frequent diarrhea episodes. SEEK IMMEDIATE MEDICAL CARE IF:   You are unable to keep fluids down.  You have persistent vomiting.  You have blood in your stool, or your stools are black and tarry.  You do not urinate in 6-8 hours, or there is only a small amount of very dark urine.  You have abdominal pain that increases or localizes.  You have weakness, dizziness, confusion, or light-headedness.  You have a severe headache.  Your diarrhea gets worse or does not get better.  You have a fever or persistent symptoms for more than 2-3 days.  You have a fever and your symptoms suddenly get worse. MAKE SURE YOU:   Understand these instructions.  Will watch your condition.  Will get help right away if you are not doing well or get worse.   This information is not intended to replace advice given to you by your health care provider. Make sure you discuss any questions you have with your health care provider.   Document Released: 06/28/2002 Document Revised: 07/29/2014 Document Reviewed: 03/15/2012 Elsevier Interactive Patient Education [employer]

## 2016-07-13 ENCOUNTER — Ambulatory Visit (INDEPENDENT_AMBULATORY_CARE_PROVIDER_SITE_OTHER): Payer: BLUE CROSS/BLUE SHIELD

## 2016-07-13 ENCOUNTER — Ambulatory Visit (HOSPITAL_COMMUNITY)
Admission: EM | Admit: 2016-07-13 | Discharge: 2016-07-13 | Disposition: A | Discharge status: Home / Self Care | Payer: BLUE CROSS/BLUE SHIELD | Attending: Emergency Medicine | Admitting: Emergency Medicine

## 2016-07-13 ENCOUNTER — Encounter (HOSPITAL_COMMUNITY): Payer: Self-pay | Admitting: *Deleted

## 2016-07-13 DIAGNOSIS — K5900 Constipation, unspecified: Secondary | ICD-10-CM

## 2016-07-13 LAB — POCT URINALYSIS DIP (DEVICE)
BILIRUBIN URINE: NEGATIVE
GLUCOSE, UA: NEGATIVE mg/dL
Hgb urine dipstick: NEGATIVE
Ketones, ur: NEGATIVE mg/dL
LEUKOCYTES UA: NEGATIVE
NITRITE: NEGATIVE
Protein, ur: NEGATIVE mg/dL
Specific Gravity, Urine: 1.015 (ref 1.005–1.030)
UROBILINOGEN UA: 1 mg/dL (ref 0.0–1.0)
pH: 7.5 (ref 5.0–8.0)

## 2016-07-13 LAB — POCT PREGNANCY, URINE: PREG TEST UR: NEGATIVE

## 2016-07-13 NOTE — ED Provider Notes (Signed)
[facility]    CSN: [id number] Arrival date & time: 07/13/16  1415     History   Chief Complaint No chief complaint on file.   HPI Pamela Patterson is a 20 y.o. female.   HPI She is a 20 year old woman here for evaluation of abdominal pain. She reports a four-day history of intermittent left abdominal pain. She states she often will feel a "burning, but not burning" pain in this area. She will get sharp pains there after eating and smoking. These are very brief. No associated vomiting or nausea. She has been having some loose stool, but this has been going on for several months. No blood in the stool. No recent history of constipation. Denies any dysuria or urinary symptoms. No vaginal discharge. Her last menstrual period was November 16, but states she has irregular cycles. No fevers.  Past Medical History:  Diagnosis Date  . Diverticulitis   . Sickle cell trait (HCC)     There are no active problems to display for this patient.   Past Surgical History:  Procedure Laterality Date  . ADENOIDECTOMY    . TONSILLECTOMY      OB History    Gravida Para Term Preterm AB Living   1       1     SAB TAB Ectopic Multiple Live Births     1             Home Medications    Prior to Admission medications   Not on File    Family History No family history on file.  Social History Social History  Substance Use Topics  . Smoking status: Current Some Day Smoker    Packs/day: 0.00    Types: Cigarettes  . Smokeless tobacco: Never Used  . Alcohol use Yes     Comment: rarely     Allergies   Patient has no known allergies.   Review of Systems Review of Systems As in history of present illness  Physical Exam Triage Vital Signs ED Triage Vitals [07/13/16 1545]  Enc Vitals Group     BP 138/90     Pulse Rate 66     Resp 16     Temp 98.1 F (36.7 C)     Temp Source Oral     SpO2 100 %     Weight      Height      Head Circumference      Peak Flow    Pain Score      Pain Loc      Pain Edu?      Excl. in GC?    No data found.   Updated Vital Signs BP 138/90   Pulse 66   Temp 98.1 F (36.7 C) (Oral)   Resp 16   LMP 06/06/2016 (Exact Date) Comment: irregular menses  SpO2 100%   Visual Acuity Right Eye Distance:   Left Eye Distance:   Bilateral Distance:    Right Eye Near:   Left Eye Near:    Bilateral Near:     Physical Exam  Constitutional: She is oriented to person, place, and time. She appears well-developed and well-nourished. No distress.  Neck: Neck supple.  Cardiovascular: Normal rate.   Abdominal: Soft. Bowel sounds are normal. She exhibits no distension and no mass. There is tenderness (mild to deep palpation just to the left of the umbilicus). There is no rebound and no guarding.  Neurological: She is alert and oriented to person,  place, and time.     UC Treatments / Results  Labs (all labs ordered are listed, but only abnormal results are displayed) Labs Reviewed  POCT URINALYSIS DIP (DEVICE)  POCT PREGNANCY, URINE    EKG  EKG Interpretation None       Radiology Dg Abd 1 View  Result Date: 07/13/2016 CLINICAL DATA:  Abdominal pain for several days, initial encounter EXAM: ABDOMEN - 1 VIEW COMPARISON:  None. FINDINGS: Scattered large and small bowel gas is noted. No abnormal mass or abnormal calcifications are seen. No free air is noted. Mild retained fecal material is noted consistent with a degree of constipation. No bony abnormality is seen. IMPRESSION: Mild constipation.  No other acute abnormality noted. Electronically Signed   By: Alcide CleverMark  Lukens M.D.   On: 07/13/2016 16:52    Procedures Procedures (including critical care time)  Medications Ordered in UC Medications - No data to display   Initial Impression / Assessment and Plan / UC Course  I have reviewed the triage vital signs and the nursing notes.  Pertinent labs & imaging results that were available during my care of the patient  were reviewed by me and considered in my medical decision making (see chart for details).  Clinical Course     X-ray shows some constipation. Treat with OTC MiraLAX. Follow-up as needed.  Final Clinical Impressions(s) / UC Diagnoses   Final diagnoses:  Constipation, unspecified constipation type    New Prescriptions Current Discharge Medication List       Charm RingsErin J Kaylianna Detert, MD 07/13/16 1704

## 2016-07-13 NOTE — Discharge Instructions (Signed)
The x-ray shows some mild constipation. Get some MiraLAX from the drugstore. Take this 3 times a day until you're having diarrhea. Once you're cleaned out, you can stop the MiraLAX. Follow-up as needed.

## 2016-07-13 NOTE — ED Triage Notes (Signed)
C/O LUQ abd pain x 4 days.  States pain is frequent, but not constant.  Denies n/v, fevers.  C/O diarrhea - few episodes each day since onset of pain.

## 2016-07-13 NOTE — ED Triage Notes (Addendum)
Also c/o ongoing occasional slight "burning" in abd.  Denies any vaginal discharge.

## 2016-07-13 NOTE — ED Notes (Signed)
Urine specimen obtained while patient in lobby. Specimen in lab 

## 2016-08-10 ENCOUNTER — Encounter (HOSPITAL_COMMUNITY): Payer: Self-pay | Admitting: *Deleted

## 2016-08-10 ENCOUNTER — Inpatient Hospital Stay (HOSPITAL_COMMUNITY)
Admission: AD | Admit: 2016-08-10 | Discharge: 2016-08-10 | Disposition: A | Discharge status: Home / Self Care | Payer: BLUE CROSS/BLUE SHIELD | Source: Ambulatory Visit | Attending: Obstetrics and Gynecology | Admitting: Obstetrics and Gynecology

## 2016-08-10 DIAGNOSIS — D573 Sickle-cell trait: Secondary | ICD-10-CM | POA: Diagnosis not present

## 2016-08-10 DIAGNOSIS — B9689 Other specified bacterial agents as the cause of diseases classified elsewhere: Secondary | ICD-10-CM | POA: Insufficient documentation

## 2016-08-10 DIAGNOSIS — N760 Acute vaginitis: Secondary | ICD-10-CM | POA: Diagnosis not present

## 2016-08-10 DIAGNOSIS — F17210 Nicotine dependence, cigarettes, uncomplicated: Secondary | ICD-10-CM | POA: Diagnosis not present

## 2016-08-10 DIAGNOSIS — N898 Other specified noninflammatory disorders of vagina: Secondary | ICD-10-CM | POA: Diagnosis present

## 2016-08-10 LAB — URINALYSIS, ROUTINE W REFLEX MICROSCOPIC
BILIRUBIN URINE: NEGATIVE
Glucose, UA: 150 mg/dL — AB
HGB URINE DIPSTICK: NEGATIVE
Ketones, ur: NEGATIVE mg/dL
Leukocytes, UA: NEGATIVE
Nitrite: NEGATIVE
PH: 6 (ref 5.0–8.0)
Protein, ur: NEGATIVE mg/dL
SPECIFIC GRAVITY, URINE: 1.014 (ref 1.005–1.030)

## 2016-08-10 LAB — WET PREP, GENITAL
SPERM: NONE SEEN
Trich, Wet Prep: NONE SEEN
YEAST WET PREP: NONE SEEN

## 2016-08-10 LAB — RAPID HIV SCREEN (HIV 1/2 AB+AG)
HIV 1/2 Antibodies: NONREACTIVE
HIV-1 P24 ANTIGEN - HIV24: NONREACTIVE

## 2016-08-10 LAB — POCT PREGNANCY, URINE: PREG TEST UR: NEGATIVE

## 2016-08-10 MED ORDER — METRONIDAZOLE 500 MG PO TABS: 500.0000 mg | ORAL_TABLET | Freq: Two times a day (BID) | ORAL | 0 refills | Status: DC

## 2016-08-10 NOTE — MAU Note (Signed)
Pt has some mucus discharge no itching or burning just discharge.  Denies VB/cramping.

## 2016-08-10 NOTE — MAU Provider Note (Signed)
History   21 yo G1P0010 in with c/o increased vag discharge. Yellowish in nature. Started apx 1 wk ago. States having unprotected sex, last time 2 days ago.  CSN: [id number]  Arrival date & time 08/10/16  1450   None     Chief Complaint  Patient presents with  . Vaginal Discharge    HPI  Past Medical History:  Diagnosis Date  . Diverticulitis   . Sickle cell trait [facility](HCC)     Past Surgical History:  Procedure Laterality Date  . ADENOIDECTOMY    . TONSILLECTOMY      History reviewed. No pertinent family history.  Social History  Substance Use Topics  . Smoking status: Current Some Day Smoker    Packs/day: 0.00    Types: Cigarettes  . Smokeless tobacco: Never Used  . Alcohol use Yes     Comment: rarely    OB History    Gravida Para Term Preterm AB Living   1       1     SAB TAB Ectopic Multiple Live Births     1            Review of Systems  Constitutional: Negative.   HENT: Negative.   Eyes: Negative.   Respiratory: Negative.   Cardiovascular: Negative.   Gastrointestinal: Negative.   Genitourinary: Positive for vaginal discharge.  Musculoskeletal: Negative.   Skin: Negative.   Allergic/Immunologic: Negative.   Neurological: Negative.   Hematological: Negative.   Psychiatric/Behavioral: Negative.     Allergies  Patient has no known allergies.  Home Medications    BP 138/83 (BP Location: Left Arm)   Pulse 90   Temp 98 F (36.7 C) (Oral)   Resp 16   LMP 07/25/2016 Comment: irregular menses  Physical Exam  Constitutional: She is oriented to person, place, and time. She appears well-developed and well-nourished.  HENT:  Head: Normocephalic.  Eyes: Pupils are equal, round, and reactive to light.  Neck: Normal range of motion.  Cardiovascular: Normal rate, regular rhythm, normal heart sounds and intact distal pulses.   Pulmonary/Chest: Effort normal and breath sounds normal.  Abdominal: Soft. Bowel sounds are normal.  Genitourinary: Uterus  normal. Vaginal discharge found.  Musculoskeletal: Normal range of motion.  Neurological: She is alert and oriented to person, place, and time. She has normal reflexes.  Skin: Skin is warm and dry.  Psychiatric: She has a normal mood and affect. Her behavior is normal. Judgment and thought content normal.    MAU Course  Procedures (including critical care time)  Labs Reviewed  URINALYSIS, ROUTINE W REFLEX MICROSCOPIC - Abnormal; Notable for the following:       Result Value   Glucose, UA 150 (*)    All other components within normal limits  WET PREP, GENITAL  GC/CHLAMYDIA PROBE AMP ([place]) NOT AT [facility]   No results found.   No diagnosis found. BV   MDM  Wet prep pos clue . GC, Chla, HIV, Hep B and C done. Lengthy discussion regarding risk of having unprotected sex. Stress importance of consistant condom use to prevent STD's. Pt verbalized understanding. Stressed importance of retesting in six months. RX for flagyl. D/c home

## 2016-08-10 NOTE — Discharge Instructions (Signed)

## 2016-08-11 LAB — HEPATITIS PANEL, ACUTE
HCV Ab: <0.1 s/co ratio (ref 0.0–0.9)
Hep A IgM: NEGATIVE
Hep B C IgM: NEGATIVE
Hepatitis B Surface Ag: NEGATIVE

## 2016-08-11 LAB — HEPATITIS B SURFACE ANTIGEN: Hepatitis B Surface Ag: NEGATIVE

## 2016-08-12 LAB — GC/CHLAMYDIA PROBE AMP (~~LOC~~) NOT AT ARMC
CHLAMYDIA, DNA PROBE: NEGATIVE
Neisseria Gonorrhea: NEGATIVE

## 2016-12-12 ENCOUNTER — Emergency Department (HOSPITAL_COMMUNITY)
Admission: EM | Admit: 2016-12-12 | Discharge: 2016-12-12 | Disposition: A | Discharge status: Home / Self Care | Payer: BLUE CROSS/BLUE SHIELD | Attending: Emergency Medicine | Admitting: Emergency Medicine

## 2016-12-12 ENCOUNTER — Encounter (HOSPITAL_COMMUNITY): Payer: Self-pay | Admitting: Emergency Medicine

## 2016-12-12 DIAGNOSIS — Y999 Unspecified external cause status: Secondary | ICD-10-CM | POA: Diagnosis not present

## 2016-12-12 DIAGNOSIS — Z79899 Other long term (current) drug therapy: Secondary | ICD-10-CM | POA: Diagnosis not present

## 2016-12-12 DIAGNOSIS — S060X0A Concussion without loss of consciousness, initial encounter: Secondary | ICD-10-CM | POA: Diagnosis not present

## 2016-12-12 DIAGNOSIS — Y92410 Unspecified street and highway as the place of occurrence of the external cause: Secondary | ICD-10-CM | POA: Diagnosis not present

## 2016-12-12 DIAGNOSIS — Y939 Activity, unspecified: Secondary | ICD-10-CM | POA: Insufficient documentation

## 2016-12-12 DIAGNOSIS — G44309 Post-traumatic headache, unspecified, not intractable: Secondary | ICD-10-CM | POA: Insufficient documentation

## 2016-12-12 DIAGNOSIS — F0781 Postconcussional syndrome: Secondary | ICD-10-CM | POA: Diagnosis not present

## 2016-12-12 DIAGNOSIS — S0990XA Unspecified injury of head, initial encounter: Secondary | ICD-10-CM | POA: Diagnosis present

## 2016-12-12 DIAGNOSIS — F17210 Nicotine dependence, cigarettes, uncomplicated: Secondary | ICD-10-CM | POA: Insufficient documentation
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
## 2016-12-21 ENCOUNTER — Encounter (HOSPITAL_COMMUNITY): Payer: Self-pay

## 2016-12-21 ENCOUNTER — Emergency Department (HOSPITAL_COMMUNITY)
Admission: EM | Admit: 2016-12-21 | Discharge: 2016-12-22 | Disposition: A | Discharge status: Home / Self Care | Payer: BLUE CROSS/BLUE SHIELD | Attending: Emergency Medicine | Admitting: Emergency Medicine

## 2016-12-21 DIAGNOSIS — R1030 Lower abdominal pain, unspecified: Secondary | ICD-10-CM | POA: Diagnosis not present

## 2016-12-21 DIAGNOSIS — N941 Unspecified dyspareunia: Secondary | ICD-10-CM | POA: Diagnosis not present

## 2016-12-21 DIAGNOSIS — F17210 Nicotine dependence, cigarettes, uncomplicated: Secondary | ICD-10-CM | POA: Insufficient documentation

## 2016-12-21 DIAGNOSIS — N7689 Other specified inflammation of vagina and vulva: Secondary | ICD-10-CM | POA: Diagnosis present

## 2016-12-21 DIAGNOSIS — N898 Other specified noninflammatory disorders of vagina: Secondary | ICD-10-CM

## 2016-12-21 LAB — URINALYSIS, ROUTINE W REFLEX MICROSCOPIC
BILIRUBIN URINE: NEGATIVE
Glucose, UA: NEGATIVE mg/dL
HGB URINE DIPSTICK: NEGATIVE
KETONES UR: NEGATIVE mg/dL
NITRITE: NEGATIVE
PH: 7 (ref 5.0–8.0)
Protein, ur: NEGATIVE mg/dL
SPECIFIC GRAVITY, URINE: 1.011 (ref 1.005–1.030)

## 2016-12-21 LAB — WET PREP, GENITAL
SPERM: NONE SEEN
Trich, Wet Prep: NONE SEEN
YEAST WET PREP: NONE SEEN

## 2016-12-21 LAB — POC URINE PREG, ED: Preg Test, Ur: NEGATIVE

## 2016-12-21 NOTE — ED Triage Notes (Signed)
Pt complaining of vaginal dryness. Pt denies any vaginal bleeding or discharge. Pt denies any urinary symptoms, states some white cloudiness. Pt denies any abdominal pain or N/V/D.

## 2016-12-21 NOTE — ED Provider Notes (Signed)
[facility]   CSN: [id number] Arrival date & time: 12/21/16  2027  By signing my name below, I, Pamela Patterson, attest that this documentation has been prepared under the direction and in the presence of Kerrie BuffaloHope Sela Falk, NP.  Electronically Signed: Vista Minkobert Patterson, ED Scribe. 12/22/16. 12:12 AM.  History   Chief Complaint Chief Complaint  Patient presents with  . Vaginal Dryness    HPI HPI Comments: Pamela Patterson is a 21 y.o. female who presents to the Emergency Department complaining of persistent vaginal dryness that started 5 days ago. She reports pain during sexual intercourse.She also reports a mild intermittent lower abdominal pain. Pt reports Hx of Gonorrhea two years ago. Pt has had the same sexual partner for approximately 1 year. No dysuria, vaginal itching or discharge. She states that her last pap smear was normal. LNMP May 12th, 2018.   The history is provided by the patient. No language interpreter was used.    Past Medical History:  Diagnosis Date  . Diverticulitis   . Sickle cell trait (HCC)     There are no active problems to display for this patient.   Past Surgical History:  Procedure Laterality Date  . ADENOIDECTOMY    . TONSILLECTOMY      OB History    Gravida Para Term Preterm AB Living   1       1     SAB TAB Ectopic Multiple Live Births     1             Home Medications    Prior to Admission medications   Medication Sig Start Date End Date Taking? Authorizing Provider  metroNIDAZOLE (FLAGYL) 500 MG tablet Take 1 tablet (500 mg total) by mouth 2 (two) times daily. 08/10/16   Montez MoritaLawson, Marie D, CNM    Family History History reviewed. No pertinent family history.  Social History Social History  Substance Use Topics  . Smoking status: Current Some Day Smoker    Packs/day: 0.00    Types: Cigarettes  . Smokeless tobacco: Never Used  . Alcohol use Yes     Comment: rarely     Allergies   Patient has no known  allergies.   Review of Systems Review of Systems  Gastrointestinal: Positive for abdominal pain (intermittent).  Genitourinary: Positive for dyspareunia. Negative for dysuria, hematuria, vaginal bleeding and vaginal discharge.     Physical Exam Updated Vital Signs BP 123/79 (BP Location: Right Arm)   Pulse 90   Temp 98.3 F (36.8 C)   Resp 16   LMP 11/30/2016 (Exact Date)   SpO2 100%   Physical Exam  Constitutional: She is oriented to person, place, and time. She appears well-developed and well-nourished.  Eyes: EOM are normal.  Neck: Neck supple.  Pulmonary/Chest: Effort normal.  Abdominal: Soft. There is no tenderness.  Genitourinary:  Genitourinary Comments: External genitalia without lesions, mucous d/c vaginal vault, no CMT, no adnexal tenderness. Uterus without palpable enlargement.   Musculoskeletal: Normal range of motion.  Neurological: She is alert and oriented to person, place, and time. No cranial nerve deficit.  Skin: Skin is warm and dry.  Nursing note and vitals reviewed.    ED Treatments / Results  DIAGNOSTIC STUDIES: Oxygen Saturation is 99% on RA, normal by my interpretation.  COORDINATION OF CARE: 11:11 AM-Will do pelvic exam. Discussed treatment plan with pt at bedside and pt agreed to plan.   Labs (all labs ordered are listed, but only abnormal results are displayed)  Labs Reviewed  WET PREP, GENITAL - Abnormal; Notable for the following:       Result Value   Clue Cells Wet Prep HPF POC PRESENT (*)    WBC, Wet Prep HPF POC MANY (*)    All other components within normal limits  URINALYSIS, ROUTINE W REFLEX MICROSCOPIC - Abnormal; Notable for the following:    Color, Urine STRAW (*)    Leukocytes, UA TRACE (*)    Bacteria, UA RARE (*)    Squamous Epithelial / LPF 0-5 (*)    All other components within normal limits  POC URINE PREG, ED  GC/CHLAMYDIA PROBE AMP ([place]) NOT AT [facility]   Radiology No results found.  Procedures Procedures  (including critical care time)  Medications Ordered in ED Medications - No data to display   Initial Impression / Assessment and Plan / ED Course  I have reviewed the triage vital signs and the nursing notes.  Pertinent lab results that were available during my care of the patient were reviewed by me and considered in my medical decision making (see chart for details).   Final Clinical Impressions(s) / ED Diagnoses  21 y.o. female with feeling of vaginal dryness and dyspareunia stable for d/c without any acute emergency. She is to follow up with her GYN for further evaluation.   Final diagnoses:  Dyspareunia, female  Vaginal dryness    New Prescriptions Discharge Medication List as of 12/22/2016 12:02 AM    I personally performed the services described in this documentation, which was scribed in my presence. The recorded information has been reviewed and is accurate.     Kerrie Buffalo [place], [place] 12/22/16 [id number]    Derwood Kaplan, MD 12/27/16 1755

## 2016-12-22 NOTE — Discharge Instructions (Signed)
Stop the [place] soap and go back to using [employer]. [facility].

## 2016-12-23 LAB — GC/CHLAMYDIA PROBE AMP (~~LOC~~) NOT AT ARMC
Chlamydia: NEGATIVE
NEISSERIA GONORRHEA: NEGATIVE

## 2017-01-21 ENCOUNTER — Emergency Department (HOSPITAL_COMMUNITY)
Admission: EM | Admit: 2017-01-21 | Discharge: 2017-01-21 | Disposition: A | Discharge status: Home / Self Care | Payer: 59 | Attending: Emergency Medicine | Admitting: Emergency Medicine

## 2017-01-21 ENCOUNTER — Encounter (HOSPITAL_COMMUNITY): Payer: Self-pay | Admitting: Emergency Medicine

## 2017-01-21 DIAGNOSIS — R109 Unspecified abdominal pain: Secondary | ICD-10-CM

## 2017-01-21 DIAGNOSIS — R197 Diarrhea, unspecified: Secondary | ICD-10-CM | POA: Insufficient documentation

## 2017-01-21 DIAGNOSIS — R51 Headache: Secondary | ICD-10-CM | POA: Insufficient documentation

## 2017-01-21 DIAGNOSIS — F17210 Nicotine dependence, cigarettes, uncomplicated: Secondary | ICD-10-CM | POA: Insufficient documentation

## 2017-01-21 LAB — COMPREHENSIVE METABOLIC PANEL
ALT: 22 U/L (ref 14–54)
AST: 21 U/L (ref 15–41)
Albumin: 4.1 g/dL (ref 3.5–5.0)
Alkaline Phosphatase: 71 U/L (ref 38–126)
Anion gap: 8 (ref 5–15)
BILIRUBIN TOTAL: 0.9 mg/dL (ref 0.3–1.2)
BUN: 9 mg/dL (ref 6–20)
CHLORIDE: 106 mmol/L (ref 101–111)
CO2: 25 mmol/L (ref 22–32)
CREATININE: 0.65 mg/dL (ref 0.44–1.00)
Calcium: 8.9 mg/dL (ref 8.9–10.3)
Glucose, Bld: 94 mg/dL (ref 65–99)
POTASSIUM: 3.9 mmol/L (ref 3.5–5.1)
Sodium: 139 mmol/L (ref 135–145)
TOTAL PROTEIN: 7.1 g/dL (ref 6.5–8.1)

## 2017-01-21 LAB — CBC
HEMATOCRIT: 37.2 % (ref 36.0–46.0)
Hemoglobin: 12.8 g/dL (ref 12.0–15.0)
MCH: 30.2 pg (ref 26.0–34.0)
MCHC: 34.4 g/dL (ref 30.0–36.0)
MCV: 87.7 fL (ref 78.0–100.0)
PLATELETS: 236 10*3/uL (ref 150–400)
RBC: 4.24 MIL/uL (ref 3.87–5.11)
RDW: 12.1 % (ref 11.5–15.5)
WBC: 8.4 10*3/uL (ref 4.0–10.5)

## 2017-01-21 LAB — POC URINE PREG, ED: Preg Test, Ur: NEGATIVE

## 2017-01-21 LAB — LIPASE, BLOOD: LIPASE: 11 U/L (ref 11–51)

## 2017-01-21 NOTE — ED Triage Notes (Addendum)
Charted triage on wrong patient.

## 2017-01-21 NOTE — ED Provider Notes (Signed)
[facility]-EMERGENCY DEPT Provider Note   CSN: [id number] Arrival date & time: 01/21/17  9562     History   Chief Complaint Chief Complaint  Patient presents with  . Headache  . Diarrhea    HPI Pamela Patterson is a 21 y.o. female.  The history is provided by the patient.  Diarrhea   This is a recurrent problem. Episode onset: 1 week. The problem occurs 5 to 10 times per day. The problem has not changed since onset.The stool consistency is described as watery. There has been no fever. Associated symptoms include abdominal pain (cramping) and headaches (intermittent; associated with hunger. Currently without headache.). Pertinent negatives include no vomiting, no chills, no sweats, no myalgias, no URI and no cough. She has tried nothing for the symptoms.   Patient denied any suspicious food intake, travel, sick contacts, recent antibiotic use.  Past medical history Notes diverticulitis however patient does not remember being diagnosed with this. On review of records no mention of diverticulitis in the nodes and no CT imaging to confirm. Last CT scan was performed in 2011 which was unremarkable. Patient denied ever having a colonoscopy performed.   Past Medical History:  Diagnosis Date  . Diverticulitis   . Sickle cell trait (HCC)     There are no active problems to display for this patient.   Past Surgical History:  Procedure Laterality Date  . ADENOIDECTOMY    . TONSILLECTOMY      OB History    Gravida Para Term Preterm AB Living   1       1     SAB TAB Ectopic Multiple Live Births     1             Home Medications    Prior to Admission medications   Medication Sig Start Date End Date Taking? Authorizing Provider  metroNIDAZOLE (FLAGYL) 500 MG tablet Take 1 tablet (500 mg total) by mouth 2 (two) times daily. 08/10/16   Montez Morita, CNM    Family History History reviewed. No pertinent family history.  Social History Social History  Substance Use Topics  .  Smoking status: Current Some Day Smoker    Packs/day: 0.00    Types: Cigarettes  . Smokeless tobacco: Never Used  . Alcohol use Yes     Comment: rarely     Allergies   Patient has no known allergies.   Review of Systems Review of Systems  Constitutional: Negative for chills.  Respiratory: Negative for cough.   Gastrointestinal: Positive for abdominal pain (cramping) and diarrhea. Negative for vomiting.  Musculoskeletal: Negative for myalgias.  Neurological: Positive for headaches (intermittent; associated with hunger. Currently without headache.).  All other systems are reviewed and are negative for acute change except as noted in the HPI    Physical Exam Updated Vital Signs BP (!) 124/93 (BP Location: Right Arm)   Pulse 83   Temp 98.2 F (36.8 C) (Oral)   Resp 16   LMP 11/30/2016   SpO2 95%   Physical Exam  Constitutional: She is oriented to person, place, and time. She appears well-developed and well-nourished. No distress.  HENT:  Head: Normocephalic and atraumatic.  Right Ear: External ear normal.  Left Ear: External ear normal.  Nose: Nose normal.  Eyes: Conjunctivae and EOM are normal. No scleral icterus.  Neck: Normal range of motion and phonation normal.  Cardiovascular: Normal rate and regular rhythm.   Pulmonary/Chest: Effort normal. No stridor. No respiratory distress.  Abdominal: She exhibits  no distension. There is no hepatosplenomegaly. There is no tenderness. There is no rigidity, no rebound, no guarding and no CVA tenderness. No hernia.  Musculoskeletal: Normal range of motion. She exhibits no edema.  Neurological: She is alert and oriented to person, place, and time.  Skin: She is not diaphoretic.  Psychiatric: She has a normal mood and affect. Her behavior is normal.  Vitals reviewed.    ED Treatments / Results  Labs (all labs ordered are listed, but only abnormal results are displayed) Labs Reviewed  LIPASE, BLOOD  COMPREHENSIVE METABOLIC  PANEL  CBC  POC URINE PREG, ED    EKG  EKG Interpretation None       Radiology No results found.  Procedures Procedures (including critical care time)  Medications Ordered in ED Medications - No data to display   Initial Impression / Assessment and Plan / ED Course  I have reviewed the triage vital signs and the nursing notes.  Pertinent labs & imaging results that were available during my care of the patient were reviewed by me and considered in my medical decision making (see chart for details).     The patient appears well, in no acute distress, without evidence of toxicity or dehydration. Abdomen benign. Labs grossly reassuring. UPT negative.  Patient tolerating by mouth.  No evidence to suggest serious intra-abdominal inflammatory/infectious process. Recommended over-the-counter management.  The patient is safe for discharge with strict return precautions.    Final Clinical Impressions(s) / ED Diagnoses   Final diagnoses:  Diarrhea, unspecified type  Abdominal cramping   Disposition: Discharge  Condition: Good  I have discussed the results, Dx and Tx plan with the patient who expressed understanding and agree(s) with the plan. Discharge instructions discussed at great length. The patient was given strict return precautions who verbalized understanding of the instructions. No further questions at time of discharge.    New Prescriptions   No medications on file    Follow Up: Primary care provider  Schedule an appointment as soon as possible for a visit  As needed      Cardama, Amadeo GarnetPedro Eduardo, MD 01/21/17 1128

## 2017-01-21 NOTE — ED Triage Notes (Signed)
Patient c/o headache and diarrhea x1 week. Denies N/V, abdominal pain.

## 2017-02-18 ENCOUNTER — Ambulatory Visit (HOSPITAL_COMMUNITY)
Admission: EM | Admit: 2017-02-18 | Discharge: 2017-02-18 | Disposition: A | Discharge status: Home / Self Care | Payer: 59 | Attending: Emergency Medicine | Admitting: Emergency Medicine

## 2017-02-18 ENCOUNTER — Encounter (HOSPITAL_COMMUNITY): Payer: Self-pay | Admitting: Emergency Medicine

## 2017-02-18 DIAGNOSIS — R42 Dizziness and giddiness: Secondary | ICD-10-CM

## 2017-02-18 DIAGNOSIS — H6123 Impacted cerumen, bilateral: Secondary | ICD-10-CM | POA: Diagnosis not present

## 2017-02-18 DIAGNOSIS — Z3202 Encounter for pregnancy test, result negative: Secondary | ICD-10-CM | POA: Diagnosis not present

## 2017-02-18 DIAGNOSIS — I951 Orthostatic hypotension: Secondary | ICD-10-CM

## 2017-02-18 LAB — POCT I-STAT, CHEM 8
BUN: 9 mg/dL (ref 6–20)
CALCIUM ION: 1.2 mmol/L (ref 1.15–1.40)
CHLORIDE: 102 mmol/L (ref 101–111)
Creatinine, Ser: 0.7 mg/dL (ref 0.44–1.00)
Glucose, Bld: 77 mg/dL (ref 65–99)
HCT: 41 % (ref 36.0–46.0)
Hemoglobin: 13.9 g/dL (ref 12.0–15.0)
Potassium: 4.2 mmol/L (ref 3.5–5.1)
Sodium: 140 mmol/L (ref 135–145)
TCO2: 26 mmol/L (ref 0–100)

## 2017-02-18 LAB — POCT PREGNANCY, URINE: Preg Test, Ur: NEGATIVE

## 2017-02-18 NOTE — ED Provider Notes (Signed)
CSN: [id number]     Arrival date & time 02/18/17  1633 History   None    Chief Complaint  Patient presents with  . Dizziness   (Consider location/radiation/quality/duration/timing/severity/associated sxs/prior Treatment) Pamela Patterson is a 21 y.o. female who [facility] with a chief complaint of dizziness for 1 day.     The history is provided by the patient.  Dizziness  Quality:  Lightheadedness and head spinning Severity:  Moderate Onset quality:  Gradual Duration:  1 day Timing:  Intermittent Progression:  Unchanged Chronicity:  New Context: physical activity   Context: not when bending over, not with bowel movement, not with head movement, not with loss of consciousness, not with medication, not when standing up and not when urinating   Relieved by:  Being still Worsened by:  Standing up and lying down Ineffective treatments:  None tried Associated symptoms: no chest pain, no diarrhea, no headaches, no hearing loss, no nausea, no palpitations, no shortness of breath, no syncope, no tinnitus, no vision changes, no vomiting and no weakness   Risk factors: no hx of vertigo, no Meniere's disease and no new medications     Past Medical History:  Diagnosis Date  . Diverticulitis   . Sickle cell trait [facility](HCC)    Past Surgical History:  Procedure Laterality Date  . ADENOIDECTOMY    . TONSILLECTOMY     No family history on file. Social History  Substance Use Topics  . Smoking status: Current Some Day Smoker    Packs/day: 0.00    Types: Cigarettes  . Smokeless tobacco: Never Used  . Alcohol use No     Comment: rarely   OB History    Gravida Para Term Preterm AB Living   1       1     SAB TAB Ectopic Multiple Live Births     1           Review of Systems  Constitutional: Negative for chills, fatigue and fever.  HENT: Negative for congestion, ear pain, hearing loss and tinnitus.   Respiratory: Negative for shortness of breath and wheezing.    Cardiovascular: Negative for chest pain, palpitations and syncope.  Gastrointestinal: Negative for abdominal pain, diarrhea, nausea and vomiting.  Musculoskeletal: Negative.   Skin: Negative.   Neurological: Positive for dizziness. Negative for weakness and headaches.    Allergies  Patient has no known allergies.  Home Medications   Prior to Admission medications   Medication Sig Start Date End Date Taking? Authorizing Provider  metroNIDAZOLE (FLAGYL) 500 MG tablet Take 1 tablet (500 mg total) by mouth 2 (two) times daily. 08/10/16   Montez MoritaLawson, Marie D, CNM   Meds Ordered and Administered this Visit  Medications - No data to display  BP 118/77   Pulse 88   Temp 98.6 F (37 C) (Oral)   Resp 16   Ht 5\' 3"  (1.6 m)   Wt 198 lb (89.8 kg)   LMP 11/30/2016   SpO2 100%   BMI 35.07 kg/m  Orthostatic VS for the past 24 hrs:  BP- Lying Pulse- Lying BP- Sitting Pulse- Sitting BP- Standing at 0 minutes Pulse- Standing at 0 minutes  02/18/17 1842 140/81 85 117/74 83 114/82 94    Physical Exam  Constitutional: She is oriented to person, place, and time. She appears well-developed and well-nourished. No distress.  HENT:  Head: Normocephalic and atraumatic.  Right Ear: External ear normal.  Left Ear: External ear  normal.  Bilateral cerumen impaction  Eyes: Conjunctivae are normal.  Neck: Normal range of motion.  Cardiovascular: Normal rate and regular rhythm.   Pulmonary/Chest: Effort normal and breath sounds normal.  Abdominal: Soft. Bowel sounds are normal. There is no tenderness.  Neurological: She is alert and oriented to person, place, and time.  Skin: Skin is warm and dry. Capillary refill takes less than 2 seconds. No rash noted. She is not diaphoretic. No erythema.  Psychiatric: She has a normal mood and affect. Her behavior is normal.  Nursing note and vitals reviewed.   Urgent Care Course     ED EKG Date/Time: 02/18/2017 6:43 PM Performed by: Dorena BodoKENNARD,  Hildred Mollica Authorized by: Dorena BodoKENNARD, Trish Mancinelli   ECG reviewed by ED Physician in the absence of a cardiologist: no   Previous ECG:    Previous ECG:  Unavailable Interpretation:    Interpretation: normal   Rate:    ECG rate:  76   ECG rate assessment: normal   Rhythm:    Rhythm: sinus rhythm   Ectopy:    Ectopy: none   QRS:    QRS axis:  Normal Conduction:    Conduction: normal   ST segments:    ST segments:  Normal T waves:    T waves: normal   Comments:     Sinus rhythm without ectopy    (including critical care time)  Labs Review Labs Reviewed  POCT PREGNANCY, URINE  POCT I-STAT, CHEM 8    Imaging Review No results found.    MDM   1. Dizziness   2. Orthostatic hypotension   3. Bilateral impacted cerumen    Ears cleared in clinic, TM intact without signs of trauma  Systolic decrease from 140 to 161117 from laying to sitting, push fluids, establish with and follow up with a PCP as needed or go to the ER if symptoms worsen     Dorena BodoKennard, Amarachukwu Lakatos, NP 02/18/17 1911

## 2017-02-18 NOTE — ED Triage Notes (Signed)
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 PT was walking at work and felt dizzy, hot, and nauseated. PT did not lose consciousness. PT sat down and had a glass of water. PT reports she now feels tired. PT reports similar episode yesterday. PT reports she had been in a standing position for a few minutes before this occurred.

## 2017-02-18 NOTE — Discharge Instructions (Signed)
Your symptoms could be related to possible dehydration as there was a significant drop in your blood pressure going from laying down to sitting. Your other lab work is normal. Drink lots of water, rest, follow up with a primary care provider as needed for further evaluation or go to the ER if your symptoms worsen.

## 2017-09-01 ENCOUNTER — Emergency Department (HOSPITAL_COMMUNITY)
Admission: EM | Admit: 2017-09-01 | Discharge: 2017-09-01 | Disposition: A | Discharge status: Home / Self Care | Payer: 59 | Attending: Emergency Medicine | Admitting: Emergency Medicine

## 2017-09-01 ENCOUNTER — Encounter (HOSPITAL_COMMUNITY): Payer: Self-pay | Admitting: Emergency Medicine

## 2017-09-01 DIAGNOSIS — R197 Diarrhea, unspecified: Secondary | ICD-10-CM | POA: Insufficient documentation

## 2017-09-01 DIAGNOSIS — F17210 Nicotine dependence, cigarettes, uncomplicated: Secondary | ICD-10-CM | POA: Diagnosis not present

## 2017-09-01 LAB — CBC WITH DIFFERENTIAL/PLATELET
BASOS ABS: 0 10*3/uL (ref 0.0–0.1)
BASOS PCT: 0 %
Eosinophils Absolute: 0.1 10*3/uL (ref 0.0–0.7)
Eosinophils Relative: 1 %
HEMATOCRIT: 37.7 % (ref 36.0–46.0)
Hemoglobin: 12.9 g/dL (ref 12.0–15.0)
Lymphocytes Relative: 33 %
Lymphs Abs: 2.3 10*3/uL (ref 0.7–4.0)
MCH: 30.1 pg (ref 26.0–34.0)
MCHC: 34.2 g/dL (ref 30.0–36.0)
MCV: 87.9 fL (ref 78.0–100.0)
Monocytes Absolute: 0.4 10*3/uL (ref 0.1–1.0)
Monocytes Relative: 5 %
NEUTROS ABS: 4.2 10*3/uL (ref 1.7–7.7)
Neutrophils Relative %: 61 %
PLATELETS: 229 10*3/uL (ref 150–400)
RBC: 4.29 MIL/uL (ref 3.87–5.11)
RDW: 12.5 % (ref 11.5–15.5)
WBC: 7 10*3/uL (ref 4.0–10.5)

## 2017-09-01 LAB — URINALYSIS, ROUTINE W REFLEX MICROSCOPIC
BILIRUBIN URINE: NEGATIVE
GLUCOSE, UA: NEGATIVE mg/dL
HGB URINE DIPSTICK: NEGATIVE
KETONES UR: NEGATIVE mg/dL
Leukocytes, UA: NEGATIVE
Nitrite: NEGATIVE
PH: 6 (ref 5.0–8.0)
Protein, ur: NEGATIVE mg/dL
SPECIFIC GRAVITY, URINE: 1.01 (ref 1.005–1.030)

## 2017-09-01 LAB — COMPREHENSIVE METABOLIC PANEL
ALK PHOS: 77 U/L (ref 38–126)
ALT: 20 U/L (ref 14–54)
ANION GAP: 8 (ref 5–15)
AST: 23 U/L (ref 15–41)
Albumin: 3.7 g/dL (ref 3.5–5.0)
BILIRUBIN TOTAL: 0.4 mg/dL (ref 0.3–1.2)
BUN: 11 mg/dL (ref 6–20)
CALCIUM: 9.1 mg/dL (ref 8.9–10.3)
CO2: 25 mmol/L (ref 22–32)
Chloride: 108 mmol/L (ref 101–111)
Creatinine, Ser: 0.73 mg/dL (ref 0.44–1.00)
Glucose, Bld: 87 mg/dL (ref 65–99)
Potassium: 4 mmol/L (ref 3.5–5.1)
Sodium: 141 mmol/L (ref 135–145)
TOTAL PROTEIN: 7.5 g/dL (ref 6.5–8.1)

## 2017-09-01 LAB — LIPASE, BLOOD: Lipase: 20 U/L (ref 11–51)

## 2017-09-01 LAB — PREGNANCY, URINE: PREG TEST UR: NEGATIVE

## 2017-09-01 MED ORDER — ONDANSETRON HCL 4 MG PO TABS: 4.0000 mg | ORAL_TABLET | Freq: Four times a day (QID) | ORAL | 0 refills | Status: DC

## 2017-09-01 MED ORDER — SODIUM CHLORIDE 0.9 % IV BOLUS (SEPSIS)
500.0000 mL | Freq: Once | INTRAVENOUS | Status: AC
  Administered 2017-09-01: 500 mL via INTRAVENOUS

## 2017-09-01 NOTE — Discharge Instructions (Signed)
Please return for any problem.  °

## 2017-09-01 NOTE — ED Triage Notes (Signed)
Pt with intermittent diarrhea and abdominal cramping x 1 week.

## 2017-09-01 NOTE — ED Notes (Signed)
Bed: WA01 Expected date:  Expected time:  Means of arrival:  Comments: 

## 2017-09-01 NOTE — ED Provider Notes (Signed)
[facility]-EMERGENCY DEPT Provider Note   CSN: [id number] Arrival date & time: 09/01/17  1120     History   Chief Complaint Chief Complaint  Patient presents with  . Abdominal Pain  . Diarrhea    HPI Pamela Patterson is a 22 y.o. female.  22 year old female with prior history of diverticulitis and sickle cell trait presents with complaint of diarrhea and intermittent crampy abdominal pain.  She reports that she does not currently have any abdominal pain.  Patient reports that she has had diarrhea for the last 3-4 days.  Patient reports that last night and then again today she had a brief episode of crampy, diffuse abdominal pain.  This pain is now resolved.  The painful episode lasted less than 5 minutes.  Pain was diffuse and across her entire abdomen.  She denies associated nausea or vomiting.  She denies fever.  She denies any prior episodes similar to this pain.   The history is provided by the patient.  Abdominal Pain   This is a new problem. The current episode started 2 days ago. The problem occurs rarely. The problem has been resolved. The pain is associated with an unknown factor. The pain is located in the generalized abdominal region. The quality of the pain is cramping. The patient is experiencing no pain. Associated symptoms include diarrhea. Nothing aggravates the symptoms. Nothing relieves the symptoms.  Diarrhea   Associated symptoms include abdominal pain.    Past Medical History:  Diagnosis Date  . Diverticulitis   . Sickle cell trait (HCC)     There are no active problems to display for this patient.   Past Surgical History:  Procedure Laterality Date  . ADENOIDECTOMY    . TONSILLECTOMY      OB History    Gravida Para Term Preterm AB Living   1       1     SAB TAB Ectopic Multiple Live Births     1             Home Medications    Prior to Admission medications   Not on File    Family History History reviewed. No pertinent  family history.  Social History Social History   Tobacco Use  . Smoking status: Current Some Day Smoker    Packs/day: 0.00    Types: Cigarettes  . Smokeless tobacco: Never Used  Substance Use Topics  . Alcohol use: No    Comment: rarely  . Drug use: No     Allergies   Patient has no known allergies.   Review of Systems Review of Systems  Gastrointestinal: Positive for abdominal pain and diarrhea.  All other systems reviewed and are negative.    Physical Exam Updated Vital Signs BP 133/86 (BP Location: Right Arm)   Pulse 88   Temp 98 F (36.7 C) (Oral)   Resp 16   LMP 07/15/2017 Comment: pt states she has irregular periods  SpO2 100%   Physical Exam  Constitutional: She is oriented to person, place, and time. She appears well-developed and well-nourished. No distress.  HENT:  Head: Normocephalic and atraumatic.  Mouth/Throat: Oropharynx is clear and moist.  Eyes: Conjunctivae and EOM are normal. Pupils are equal, round, and reactive to light.  Neck: Normal range of motion. Neck supple.  Cardiovascular: Normal rate, regular rhythm and normal heart sounds.  Pulmonary/Chest: Effort normal and breath sounds normal. No respiratory distress.  Abdominal: Soft. She exhibits no distension. There is no tenderness.  Musculoskeletal: Normal range of motion. She exhibits no edema or deformity.  Neurological: She is alert and oriented to person, place, and time.  Skin: Skin is warm and dry.  Psychiatric: She has a normal mood and affect.  Nursing note and vitals reviewed.    ED Treatments / Results  Labs (all labs ordered are listed, but only abnormal results are displayed) Labs Reviewed  URINALYSIS, ROUTINE W REFLEX MICROSCOPIC  COMPREHENSIVE METABOLIC PANEL  CBC WITH DIFFERENTIAL/PLATELET  LIPASE, BLOOD  PREGNANCY, URINE    EKG  EKG Interpretation None       Radiology No results found.  Procedures Procedures (including critical care  time)  Medications Ordered in ED Medications  sodium chloride 0.9 % bolus 500 mL (500 mLs Intravenous New Bag/Given 09/01/17 1301)     Initial Impression / Assessment and Plan / ED Course  I have reviewed the triage vital signs and the nursing notes.  Pertinent labs & imaging results that were available during my care of the patient were reviewed by me and considered in my medical decision making (see chart for details).     MDM  Screen Complete  Patient's presentation is consistent with likely viral enteritis.  Screening labs (labs and UA) do not suggest more significant acute pathology.  Patient remained pain-free during the evaluation.  She feels improved following her ED evaluation.  Strict return precautions given and understood.  Close follow-up is advised.   Final Clinical Impressions(s) / ED Diagnoses   Final diagnoses:  Diarrhea, unspecified type    ED Discharge Orders        Ordered    ondansetron (ZOFRAN) 4 MG tablet  Every 6 hours     09/01/17 1358       Wynetta FinesMessick, Guenevere Roorda C, MD 09/01/17 1404

## 2017-10-15 ENCOUNTER — Other Ambulatory Visit: Payer: Self-pay

## 2017-10-15 ENCOUNTER — Ambulatory Visit (HOSPITAL_COMMUNITY)
Admission: EM | Admit: 2017-10-15 | Discharge: 2017-10-15 | Disposition: A | Discharge status: Home / Self Care | Payer: 59 | Attending: Family Medicine | Admitting: Family Medicine

## 2017-10-15 ENCOUNTER — Ambulatory Visit (INDEPENDENT_AMBULATORY_CARE_PROVIDER_SITE_OTHER): Payer: 59

## 2017-10-15 ENCOUNTER — Encounter (HOSPITAL_COMMUNITY): Payer: Self-pay | Admitting: Emergency Medicine

## 2017-10-15 DIAGNOSIS — R1030 Lower abdominal pain, unspecified: Secondary | ICD-10-CM

## 2017-10-15 DIAGNOSIS — R195 Other fecal abnormalities: Secondary | ICD-10-CM | POA: Diagnosis not present

## 2017-10-15 DIAGNOSIS — E669 Obesity, unspecified: Secondary | ICD-10-CM | POA: Diagnosis not present

## 2017-10-15 DIAGNOSIS — K5900 Constipation, unspecified: Secondary | ICD-10-CM

## 2017-10-15 DIAGNOSIS — D573 Sickle-cell trait: Secondary | ICD-10-CM | POA: Insufficient documentation

## 2017-10-15 DIAGNOSIS — K5641 Fecal impaction: Secondary | ICD-10-CM | POA: Diagnosis not present

## 2017-10-15 DIAGNOSIS — N926 Irregular menstruation, unspecified: Secondary | ICD-10-CM | POA: Diagnosis not present

## 2017-10-15 DIAGNOSIS — R350 Frequency of micturition: Secondary | ICD-10-CM | POA: Insufficient documentation

## 2017-10-15 DIAGNOSIS — R109 Unspecified abdominal pain: Secondary | ICD-10-CM | POA: Diagnosis present

## 2017-10-15 DIAGNOSIS — Z3202 Encounter for pregnancy test, result negative: Secondary | ICD-10-CM | POA: Diagnosis not present

## 2017-10-15 LAB — POCT I-STAT, CHEM 8
BUN: 12 mg/dL (ref 6–20)
CHLORIDE: 103 mmol/L (ref 101–111)
Calcium, Ion: 1.16 mmol/L (ref 1.15–1.40)
Creatinine, Ser: 0.8 mg/dL (ref 0.44–1.00)
Glucose, Bld: 106 mg/dL — ABNORMAL HIGH (ref 65–99)
HEMATOCRIT: 41 % (ref 36.0–46.0)
HEMOGLOBIN: 13.9 g/dL (ref 12.0–15.0)
POTASSIUM: 4 mmol/L (ref 3.5–5.1)
SODIUM: 142 mmol/L (ref 135–145)
TCO2: 28 mmol/L (ref 22–32)

## 2017-10-15 LAB — POCT URINALYSIS DIP (DEVICE)
BILIRUBIN URINE: NEGATIVE
Glucose, UA: NEGATIVE mg/dL
Hgb urine dipstick: NEGATIVE
KETONES UR: NEGATIVE mg/dL
Nitrite: NEGATIVE
Protein, ur: NEGATIVE mg/dL
Specific Gravity, Urine: 1.015 (ref 1.005–1.030)
Urobilinogen, UA: 0.2 mg/dL (ref 0.0–1.0)
pH: 7 (ref 5.0–8.0)

## 2017-10-15 LAB — POCT PREGNANCY, URINE: PREG TEST UR: NEGATIVE

## 2017-10-15 NOTE — Discharge Instructions (Addendum)
Wallis BambergMario Ramyah Pankowski, PA-C [facility]  [address], [place], [place] [id number] [phone]   Hydrate well with at least 2 liters (1 gallon) of water daily.   Salads - kale, spinach, cabbage, spring mix; use seeds like pumpkin seeds or sunflower seeds; you can also use 1-2 hard boiled eggs. Fruits - avocadoes, berries (blueberries, raspberries, blackberries), apples, oranges, pomegranate, grapefruit Seeds - quinoa, chia seeds; you can also incorporate oatmeal Vegetables - aspargus, cauliflower, broccoli, green beans, brussel spouts, bell peppers; stay away from starchy vegetables like potatoes, carrots, peas   Brussel sprouts - Cut off stems. Place in a mixing bowl that has a lid. Pour in a 1/4-1/2 cup olive oil, spices, use a light amount of parmesan. Place on a baking sheet. Bake for 10 minutes at 400F. Take it out, eat the brussel chips. Place for another 5-10 minutes.   Mashed cauliflower - Boil a bunch of cauliflower in a pot of water. Blend in a food processor with 1-2 tablespoons of butter.  Spaghetti squash -  Cut the squash in half very carefully, clean out seeds from the middle. Place 1/2 face down in a microwave safe dish with at least 2 inches of water. Make 4-6 slits on outside of spaghetti squash and microwave for 10-12 minutes. Take out the spaghetti using a metal spoon. Repeat for the other half.    Vega protein is good protein powder, make sure you use ~6 ice cubes to give it smoothie consistency together with ~4-6 ounces of vanilla soy milk. Throw cinnamon into your shake, use peanut butter. You can also use the fruits that I listed above. Throw spinach or kale into the shake.

## 2017-10-15 NOTE — ED Provider Notes (Signed)
MRN: [id number] DOB: Oct 19, 1995  Subjective:   Pamela Patterson is a 22 y.o. female presenting for several week history of daily intermittent sharp lower abdominal pain. Has associated sensation that she needs to defecate. Has loose stools but no blood in stools. Tries to eat fiber but does not do this consistently. Hydrates well, drinks a lot of sweet tea. Denies fever, vomiting, dysuria, hematuria, urinary frequency. She has not tried any medications for relief. Pamela Patterson is not currently taking any medications. Denies smoking cigarettes. Has a pmh of diverticulitis but there is no imaging in record that supports this. Her grandmother has a history of diabetes. Denies family history of diverticulitis, colon cancer, ulcerative colitis, GI issues. Also reports irregular cycles.   No Known Allergies  Past Medical History:  Diagnosis Date  . Diverticulitis   . Sickle cell trait [facility](HCC)      Past Surgical History:  Procedure Laterality Date  . ADENOIDECTOMY    . TONSILLECTOMY      Objective:   Vitals: BP 136/86 (BP Location: Left Arm)   Pulse 99   Temp 98.4 F (36.9 C) (Oral)   LMP 07/15/2017   SpO2 100%   Physical Exam  Constitutional: She is oriented to person, place, and time. She appears well-developed and well-nourished.  HENT:  Mouth/Throat: Oropharynx is clear and moist.  Eyes: No scleral icterus.  Neck: Normal range of motion. Neck supple. No thyromegaly present.  Cardiovascular: Normal rate, regular rhythm and intact distal pulses. Exam reveals no gallop and no friction rub.  No murmur heard. Pulmonary/Chest: No respiratory distress. She has no wheezes. She has no rales.  Abdominal: Soft. Bowel sounds are normal. She exhibits no distension and no mass. There is no tenderness. There is no rebound and no guarding.  Musculoskeletal: She exhibits no edema.  Neurological: She is alert and oriented to person, place, and time.  Skin: Skin is warm and dry. No rash noted. No erythema. No  pallor.  No hair growth/signs of hirsutism.   Psychiatric: She has a normal mood and affect.   Results for orders placed or performed during the hospital encounter of 10/15/17 (from the past 24 hour(s))  POCT urinalysis dip (device)     Status: Abnormal   Collection Time: 10/15/17 11:24 AM  Result Value Ref Range   Glucose, UA NEGATIVE NEGATIVE mg/dL   Bilirubin Urine NEGATIVE NEGATIVE   Ketones, ur NEGATIVE NEGATIVE mg/dL   Specific Gravity, Urine 1.015 1.005 - 1.030   Hgb urine dipstick NEGATIVE NEGATIVE   pH 7.0 5.0 - 8.0   Protein, ur NEGATIVE NEGATIVE mg/dL   Urobilinogen, UA 0.2 0.0 - 1.0 mg/dL   Nitrite NEGATIVE NEGATIVE   Leukocytes, UA SMALL (A) NEGATIVE  Pregnancy, urine POC     Status: None   Collection Time: 10/15/17 11:30 AM  Result Value Ref Range   Preg Test, Ur NEGATIVE NEGATIVE  I-STAT, chem 8     Status: Abnormal   Collection Time: 10/15/17 12:13 PM  Result Value Ref Range   Sodium 142 135 - 145 mmol/L   Potassium 4.0 3.5 - 5.1 mmol/L   Chloride 103 101 - 111 mmol/L   BUN 12 6 - 20 mg/dL   Creatinine, Ser 7.820.80 0.44 - 1.00 mg/dL   Glucose, Bld 956106 (H) 65 - 99 mg/dL   Calcium, Ion 2.131.16 0.861.15 - 1.40 mmol/L   TCO2 28 22 - 32 mmol/L   Hemoglobin 13.9 12.0 - 15.0 g/dL   HCT 57.841.0 46.936.0 - 62.946.0 %  Dg Abd 1 View  Result Date: 10/15/2017 CLINICAL DATA:  Pain for 2 weeks. EXAM: ABDOMEN - 1 VIEW COMPARISON:  07/13/2016. FINDINGS: Moderate stool burden, including apparent fecal impaction. No bowel obstruction. Large panniculus. No visible calcification or osseous abnormality. IMPRESSION: Moderate stool burden, query constipation. Similar appearance to priors. Electronically Signed   By: Elsie Stain M.D.   On: 10/15/2017 12:33   Assessment and Plan :   Lower abdominal pain  Loose stools  Irregular periods/menstrual cycles  Obesity without serious comorbidity, unspecified classification, unspecified obesity type  Urinary frequency  Constipation, unspecified  constipation type  Counseled on dietary modifications. Patient will set up PCP with me for continued work up including U/S.    Wallis Bamberg, PA-C 10/15/17 1244

## 2017-10-15 NOTE — ED Triage Notes (Signed)
C/o lower abdominal pain for weeks, Hx of constipation and gas. Denies dysuria or vaginal changes

## 2017-10-16 ENCOUNTER — Ambulatory Visit (INDEPENDENT_AMBULATORY_CARE_PROVIDER_SITE_OTHER): Payer: 59 | Admitting: Urgent Care

## 2017-10-16 ENCOUNTER — Encounter: Payer: Self-pay | Admitting: Urgent Care

## 2017-10-16 VITALS — BP 110/60 | HR 100 | Temp 98.7°F | Ht 62.99 in | Wt 209.4 lb

## 2017-10-16 DIAGNOSIS — R102 Pelvic and perineal pain: Secondary | ICD-10-CM | POA: Diagnosis not present

## 2017-10-16 DIAGNOSIS — K5900 Constipation, unspecified: Secondary | ICD-10-CM | POA: Diagnosis not present

## 2017-10-16 DIAGNOSIS — R1030 Lower abdominal pain, unspecified: Secondary | ICD-10-CM | POA: Diagnosis not present

## 2017-10-16 DIAGNOSIS — N926 Irregular menstruation, unspecified: Secondary | ICD-10-CM

## 2017-10-16 LAB — URINE CYTOLOGY ANCILLARY ONLY
Chlamydia: NEGATIVE
Neisseria Gonorrhea: NEGATIVE
Trichomonas: NEGATIVE

## 2017-10-16 LAB — HEMOGLOBIN A1C
ESTIMATED AVERAGE GLUCOSE: 97 mg/dL
Hgb A1c MFr Bld: 5 % (ref 4.8–5.6)

## 2017-10-16 NOTE — Progress Notes (Signed)
MRN: [id number] DOB: 1995-11-10  Subjective:   Pamela Patterson is a 22 y.o. female presenting for longstanding history of chronic pelvic pain, irregular menses. Cycles are irregular in when they come going between 30-60 days but typically last 4 days. She does not have a gynecologist or PCP. Has tried OCP in the past, at 22 y/o. This made her cycles and irregular bleeding worse. Reports having had a pap smear ~2 years ago and was normal. She has ongoing issues with loose stools, constipation, occasional nausea without vomiting. Admits irritability, becoming easily angered, occasional bilateral hip pain. Denies depression, headaches, ear pain, sore throat, chest pain, shob, vaginal discharge, dysuria, hematuria, lower leg swelling, rashes.   Pamela Patterson currently has no medications in their medication list. Also has No Known Allergies.  Pamela Patterson  has a past medical history of Diverticulitis, Hypertension, and Sickle cell trait (HCC). Also  has a past surgical history that includes Tonsillectomy and Adenoidectomy. Her family history includes Hypertension in her mother; Stroke in her maternal grandmother.   Objective:   Vitals: BP 110/60 (BP Location: Left Arm, Patient Position: Sitting, Cuff Size: Normal)   Pulse 100   Temp 98.7 F (37.1 C) (Oral)   Ht 5' 2.99" (1.6 m)   Wt 209 lb 6.4 oz (95 kg)   SpO2 99%   BMI 37.10 kg/m   Physical Exam  Constitutional: She is oriented to person, place, and time. She appears well-developed and well-nourished.  HENT:  Mouth/Throat: Oropharynx is clear and moist.  Eyes: Pupils are equal, round, and reactive to light. EOM are normal. Right eye exhibits no discharge. Left eye exhibits no discharge. No scleral icterus.  Neck: Normal range of motion. Neck supple. No thyromegaly present.  Cardiovascular: Normal rate, regular rhythm and intact distal pulses. Exam reveals no gallop and no friction rub.  No murmur heard. Pulmonary/Chest: No respiratory distress. She has no  wheezes. She has no rales.  Abdominal: Soft. Bowel sounds are normal. She exhibits no distension and no mass. There is tenderness (lower pelvic). There is no rebound and no guarding. No hernia.  No CVA tenderness.  Musculoskeletal: She exhibits no edema.  Lymphadenopathy:    She has no cervical adenopathy.  Neurological: She is alert and oriented to person, place, and time. She displays normal reflexes.  Skin: Skin is warm and dry. Capillary refill takes less than 2 seconds.  Psychiatric: She has a normal mood and affect.   Results for orders placed or performed during the hospital encounter of 10/15/17 (from the past 24 hour(s))  POCT urinalysis dip (device)     Status: Abnormal   Collection Time: 10/15/17 11:24 AM  Result Value Ref Range   Glucose, UA NEGATIVE NEGATIVE mg/dL   Bilirubin Urine NEGATIVE NEGATIVE   Ketones, ur NEGATIVE NEGATIVE mg/dL   Specific Gravity, Urine 1.015 1.005 - 1.030   Hgb urine dipstick NEGATIVE NEGATIVE   pH 7.0 5.0 - 8.0   Protein, ur NEGATIVE NEGATIVE mg/dL   Urobilinogen, UA 0.2 0.0 - 1.0 mg/dL   Nitrite NEGATIVE NEGATIVE   Leukocytes, UA SMALL (A) NEGATIVE  Pregnancy, urine POC     Status: None   Collection Time: 10/15/17 11:30 AM  Result Value Ref Range   Preg Test, Ur NEGATIVE NEGATIVE  I-STAT, chem 8     Status: Abnormal   Collection Time: 10/15/17 12:13 PM  Result Value Ref Range   Sodium 142 135 - 145 mmol/L   Potassium 4.0 3.5 - 5.1 mmol/L  Chloride 103 101 - 111 mmol/L   BUN 12 6 - 20 mg/dL   Creatinine, Ser 1.61 0.44 - 1.00 mg/dL   Glucose, Bld 096 (H) 65 - 99 mg/dL   Calcium, Ion 0.45 4.09 - 1.40 mmol/L   TCO2 28 22 - 32 mmol/L   Hemoglobin 13.9 12.0 - 15.0 g/dL   HCT 81.1 91.4 - 78.2 %   Dg Abd 1 View  Result Date: 10/15/2017 CLINICAL DATA:  Pain for 2 weeks. EXAM: ABDOMEN - 1 VIEW COMPARISON:  07/13/2016. FINDINGS: Moderate stool burden, including apparent fecal impaction. No bowel obstruction. Large panniculus. No visible  calcification or osseous abnormality. IMPRESSION: Moderate stool burden, query constipation. Similar appearance to priors. Electronically Signed   By: Elsie Stain M.D.   On: 10/15/2017 12:33     Assessment and Plan :   Lower abdominal pain - Plan: Ambulatory referral to Gynecology, Hemoglobin A1c  Pelvic pain in female - Plan: US Transvaginal Non-OB, US Pelvis Complete, Ambulatory referral to Gynecology  Irregular menses - Plan: US Transvaginal Non-OB, US Pelvis Complete, Ambulatory referral to Gynecology, Thyroid Panel With TSH  Constipation, unspecified constipation type - Plan: Thyroid Panel With TSH  Labs pending. Discussed management of her cycles with contraception but patient does not want to try anything for now. U/S is pending, discussed possibility of PCOS. Recommended healthy diet and exercise. Referral to gynecology is pending.  Wallis Bamberg, PA-C [facility] [phone] 10/16/2017 9:50 AM

## 2017-10-16 NOTE — Patient Instructions (Addendum)
Pelvic Pain, Female Pelvic pain is pain in your lower abdomen, below your belly button and between your hips. The pain may start suddenly (acute), keep coming back (recurring), or last a long time (chronic). Pelvic pain that lasts longer than six months is considered chronic. Pelvic pain may affect your:  Reproductive organs.  Urinary system.  Digestive tract.  Musculoskeletal system.  There are many potential causes of pelvic pain. Sometimes, the pain can be a result of digestive or urinary conditions, strained muscles or ligaments, or even reproductive conditions. Sometimes the cause of pelvic pain is not known. Follow these instructions at home:  Take over-the-counter and prescription medicines only as told by your health care provider.  Rest as told by your health care provider.  Do not have sex it if hurts.  Keep a journal of your pelvic pain. Write down: ? When the pain started. ? Where the pain is located. ? What seems to make the pain better or worse, such as food or your menstrual cycle. ? Any symptoms you have along with the pain.  Keep all follow-up visits as told by your health care provider. This is important. Contact a health care provider if:  Medicine does not help your pain.  Your pain comes back.  You have new symptoms.  You have abnormal vaginal discharge or bleeding, including bleeding after menopause.  You have a fever or chills.  You are constipated.  You have blood in your urine or stool.  You have foul-smelling urine.  You feel weak or lightheaded. Get help right away if:  You have sudden severe pain.  Your pain gets steadily worse.  You have severe pain along with fever, nausea, vomiting, or excessive sweating.  You lose consciousness. This information is not intended to replace advice given to you by your health care provider. Make sure you discuss any questions you have with your health care provider. Document Released: 06/04/2004  Document Revised: 08/02/2015 Document Reviewed: 04/28/2015 Elsevier Interactive Patient Education  2018 [employer].     Dysfunctional Uterine Bleeding Dysfunctional uterine bleeding is abnormal bleeding from the uterus. Dysfunctional uterine bleeding includes:  A period that comes earlier or later than usual.  A period that is lighter, heavier, or has blood clots.  Bleeding between periods.  Skipping one or more periods.  Bleeding after sexual intercourse.  Bleeding after menopause.  Follow these instructions at home: Pay attention to any changes in your symptoms. Follow these instructions to help with your condition: Eating and drinking  Eat well-balanced meals. Include foods that are high in iron, such as liver, meat, shellfish, green leafy vegetables, and eggs.  If you become constipated: ? Drink plenty of water. ? Eat fruits and vegetables that are high in water and fiber, such as spinach, carrots, raspberries, apples, and mango. Medicines  Take over-the-counter and prescription medicines only as told by your health care provider.  Do not change medicines without talking with your health care provider.  Aspirin or medicines that contain aspirin may make the bleeding worse. Do not take those medicines: ? During the week before your period. ? During your period.  If you were prescribed iron pills, take them as told by your health care provider. Iron pills help to replace iron that your body loses because of this condition. Activity  If you need to change your sanitary pad or tampon more than one time every 2 hours: ? Lie in bed with your feet raised (elevated). ? Place a cold pack  on your lower abdomen. ? Rest as much as possible until the bleeding stops or slows down.  Do not try to lose weight until the bleeding has stopped and your blood iron level is back to normal. Other Instructions  For two months, write down: ? When your period starts. ? When your  period ends. ? When any abnormal bleeding occurs. ? What problems you notice.  Keep all follow up visits as told by your health care provider. This is important. Contact a health care provider if:  You get light-headed or weak.  You have nausea and vomiting.  You cannot eat or drink without vomiting.  You feel dizzy or have diarrhea while you are taking medicines.  You are taking birth control pills or hormones, and you want to change them or stop taking them. Get help right away if:  You develop a fever or chills.  You need to change your sanitary pad or tampon more than one time per hour.  Your bleeding becomes heavier, or your flow contains clots more often.  You develop pain in your abdomen.  You lose consciousness.  You develop a rash. This information is not intended to replace advice given to you by your health care provider. Make sure you discuss any questions you have with your health care provider. Document Released: 07/05/2000 Document Revised: 12/14/2015 Document Reviewed: 10/03/2014 Elsevier Interactive Patient Education  2018 [employer]    IF you received an x-ray today, you will receive an invoice from [facility]. Please contact [facility] at [phone] with questions or concerns regarding your invoice.   IF you received labwork today, you will receive an invoice from [facility]. Please contact [facility] at [phone] with questions or concerns regarding your invoice.   Our billing staff will not be able to assist you with questions regarding bills from these companies.  You will be contacted with the lab results as soon as they are available. The fastest way to get your results is to activate your My Chart account. Instructions are located on the last page of this paperwork. If you have not heard from us regarding the results in 2 weeks, please contact this office.

## 2017-10-17 LAB — THYROID PANEL WITH TSH
FREE THYROXINE INDEX: 2.1 (ref 1.2–4.9)
T3 UPTAKE RATIO: 19 % — AB (ref 24–39)
T4 TOTAL: 10.8 ug/dL (ref 4.5–12.0)
TSH: 0.634 u[IU]/mL (ref 0.450–4.500)

## 2017-10-27 ENCOUNTER — Telehealth: Payer: Self-pay | Admitting: Urgent Care

## 2017-10-27 ENCOUNTER — Ambulatory Visit: Payer: 59 | Admitting: Obstetrics & Gynecology

## 2017-10-27 NOTE — Telephone Encounter (Signed)
Routed both of pt's U/S to [facility]

## 2017-10-27 NOTE — Telephone Encounter (Signed)
Copied from CRM [id number]. Topic: Referral - Status >> Oct 24, 2017  2:53 PM Waymon AmatoBurton, Donna F wrote: Reason for CRM: pt is checking on status of two ultrasound referrals for [facility] -abdominal and vaginal please call  [phone]    >> Oct 27, 2017  2:21 PM Rudi CocoLathan, Caniyah Murley M, NT wrote: pt is checking on status of two ultrasound referrals for [facility] -abdominal and vaginal please call   [phone]

## 2017-10-27 NOTE — Telephone Encounter (Signed)
Phone call to patient.  Unable to reach.

## 2017-10-30 ENCOUNTER — Other Ambulatory Visit: Payer: 59

## 2017-11-06 ENCOUNTER — Ambulatory Visit: Payer: Self-pay | Admitting: Obstetrics and Gynecology

## 2017-12-31 ENCOUNTER — Encounter (HOSPITAL_COMMUNITY): Payer: Self-pay | Admitting: Emergency Medicine

## 2017-12-31 DIAGNOSIS — I10 Essential (primary) hypertension: Secondary | ICD-10-CM | POA: Insufficient documentation

## 2017-12-31 DIAGNOSIS — R110 Nausea: Secondary | ICD-10-CM | POA: Insufficient documentation

## 2017-12-31 DIAGNOSIS — N3090 Cystitis, unspecified without hematuria: Secondary | ICD-10-CM | POA: Insufficient documentation

## 2017-12-31 DIAGNOSIS — F17210 Nicotine dependence, cigarettes, uncomplicated: Secondary | ICD-10-CM | POA: Insufficient documentation

## 2017-12-31 LAB — URINALYSIS, ROUTINE W REFLEX MICROSCOPIC
Bacteria, UA: NONE SEEN
Bilirubin Urine: NEGATIVE
Glucose, UA: NEGATIVE mg/dL
Hgb urine dipstick: NEGATIVE
Ketones, ur: NEGATIVE mg/dL
Nitrite: NEGATIVE
PH: 8 (ref 5.0–8.0)
PROTEIN: NEGATIVE mg/dL
SPECIFIC GRAVITY, URINE: 1.011 (ref 1.005–1.030)

## 2017-12-31 LAB — CBC
HCT: 38.6 % (ref 36.0–46.0)
Hemoglobin: 13.3 g/dL (ref 12.0–15.0)
MCH: 30.5 pg (ref 26.0–34.0)
MCHC: 34.5 g/dL (ref 30.0–36.0)
MCV: 88.5 fL (ref 78.0–100.0)
PLATELETS: 253 10*3/uL (ref 150–400)
RBC: 4.36 MIL/uL (ref 3.87–5.11)
RDW: 12.8 % (ref 11.5–15.5)
WBC: 9.7 10*3/uL (ref 4.0–10.5)

## 2017-12-31 LAB — COMPREHENSIVE METABOLIC PANEL
ALBUMIN: 4.1 g/dL (ref 3.5–5.0)
ALK PHOS: 72 U/L (ref 38–126)
ALT: 23 U/L (ref 14–54)
AST: 20 U/L (ref 15–41)
Anion gap: 8 (ref 5–15)
BILIRUBIN TOTAL: 0.5 mg/dL (ref 0.3–1.2)
BUN: 12 mg/dL (ref 6–20)
CALCIUM: 9.3 mg/dL (ref 8.9–10.3)
CO2: 29 mmol/L (ref 22–32)
CREATININE: 0.79 mg/dL (ref 0.44–1.00)
Chloride: 105 mmol/L (ref 101–111)
GFR calc Af Amer: >60 mL/min (ref >60)
GFR calc non Af Amer: >60 mL/min (ref >60)
GLUCOSE: 106 mg/dL — AB (ref 65–99)
Potassium: 3.9 mmol/L (ref 3.5–5.1)
SODIUM: 142 mmol/L (ref 135–145)
Total Protein: 7.4 g/dL (ref 6.5–8.1)

## 2017-12-31 LAB — I-STAT BETA HCG BLOOD, ED (MC, WL, AP ONLY): I-stat hCG, quantitative: <5 m[IU]/mL (ref <5)

## 2017-12-31 LAB — LIPASE, BLOOD: Lipase: 23 U/L (ref 11–51)

## 2017-12-31 NOTE — ED Triage Notes (Addendum)
Patient c/o nausea after eating today. Denies V/D. Denies abdominal pain.

## 2018-01-01 ENCOUNTER — Emergency Department (HOSPITAL_COMMUNITY)
Admission: EM | Admit: 2018-01-01 | Discharge: 2018-01-01 | Disposition: A | Discharge status: Home / Self Care | Payer: 59 | Attending: Emergency Medicine | Admitting: Emergency Medicine

## 2018-01-01 DIAGNOSIS — N3090 Cystitis, unspecified without hematuria: Secondary | ICD-10-CM

## 2018-01-01 DIAGNOSIS — R110 Nausea: Secondary | ICD-10-CM

## 2018-01-01 MED ORDER — METOCLOPRAMIDE HCL 10 MG PO TABS: 10.0000 mg | ORAL_TABLET | Freq: Four times a day (QID) | ORAL | 0 refills | Status: DC | PRN

## 2018-01-01 MED ORDER — NITROFURANTOIN MONOHYD MACRO 100 MG PO CAPS: 100.0000 mg | ORAL_CAPSULE | Freq: Once | ORAL | Status: DC

## 2018-01-01 MED ORDER — ONDANSETRON 8 MG PO TBDP
8.0000 mg | ORAL_TABLET | Freq: Once | ORAL | Status: AC
  Administered 2018-01-01: 8 mg via ORAL
  Filled 2018-01-01: qty 1

## 2018-01-01 MED ORDER — CEPHALEXIN 500 MG PO CAPS: 500.0000 mg | ORAL_CAPSULE | Freq: Three times a day (TID) | ORAL | 0 refills | Status: DC

## 2018-01-01 MED ORDER — CEPHALEXIN 500 MG PO CAPS
500.0000 mg | ORAL_CAPSULE | Freq: Once | ORAL | Status: AC
  Administered 2018-01-01: 500 mg via ORAL
  Filled 2018-01-01: qty 1

## 2018-01-01 NOTE — ED Provider Notes (Signed)
[facility]-EMERGENCY DEPT Provider Note   CSN: [id number] Arrival date & time: 12/31/17  1900     History   Chief Complaint Chief Complaint  Patient presents with  . Nausea    HPI Pamela Patterson is a 22 y.o. female.  The history is provided by the patient.  She has history of hypertension, diverticulitis, sickle cell trait and comes in complaining of nausea all day today.  She is also been having intermittent suprapubic pain for approximately the last 6 weeks.  She denies vomiting.  She denies fever or chills.  Nothing makes symptoms better, nothing makes it worse.  When present, she rates pain at 6/10.  She does admit to some urinary urgency and frequency but denies dysuria.  She has not tried anything to treat any of her symptoms.  Past Medical History:  Diagnosis Date  . Diverticulitis   . Hypertension   . Sickle cell trait (HCC)     There are no active problems to display for this patient.   Past Surgical History:  Procedure Laterality Date  . ADENOIDECTOMY    . TONSILLECTOMY       OB History    Gravida  1   Para      Term      Preterm      AB  1   Living        SAB      TAB  1   Ectopic      Multiple      Live Births               Home Medications    Prior to Admission medications   Not on File    Family History Family History  Problem Relation Age of Onset  . Hypertension Mother   . Stroke Maternal Grandmother     Social History Social History   Tobacco Use  . Smoking status: Current Some Day Smoker    Packs/day: 0.00    Types: Cigarettes  . Smokeless tobacco: Never Used  Substance Use Topics  . Alcohol use: Not Currently    Comment: rarely  . Drug use: No    Types: Marijuana     Allergies   Patient has no known allergies.   Review of Systems Review of Systems  All other systems reviewed and are negative.    Physical Exam Updated Vital Signs BP (!) 140/91 (BP Location: Left Arm)    Pulse 95   Temp 98.7 F (37.1 C) (Oral)   Resp 18   LMP 07/15/2017   SpO2 98%   Physical Exam  Nursing note and vitals reviewed.  22 year old female, resting comfortably and in no acute distress. Vital signs are significant for borderline elevated blood pressure. Oxygen saturation is 98%, which is normal. Head is normocephalic and atraumatic. PERRLA, EOMI. Oropharynx is clear. Neck is nontender and supple without adenopathy or JVD. Back is nontender and there is no CVA tenderness. Lungs are clear without rales, wheezes, or rhonchi. Chest is nontender. Heart has regular rate and rhythm without murmur. Abdomen is soft, flat, with mild suprapubic tenderness.  There is no rebound or guarding.  There are no masses or hepatosplenomegaly and peristalsis is hypoactive. Extremities have no cyanosis or edema, full range of motion is present. Skin is warm and dry without rash. Neurologic: Mental status is normal, cranial nerves are intact, there are no motor or sensory deficits.  ED Treatments / Results  Labs (all labs  ordered are listed, but only abnormal results are displayed) Labs Reviewed  COMPREHENSIVE METABOLIC PANEL - Abnormal; Notable for the following components:      Result Value   Glucose, Bld 106 (*)    All other components within normal limits  URINALYSIS, ROUTINE W REFLEX MICROSCOPIC - Abnormal; Notable for the following components:   Leukocytes, UA LARGE (*)    All other components within normal limits  LIPASE, BLOOD  CBC  I-STAT BETA HCG BLOOD, ED ([facility], [facility], [facility] ONLY)   Procedures Procedures   Medications Ordered in ED Medications  ondansetron (ZOFRAN-ODT) disintegrating tablet 8 mg (has no administration in time range)  cephALEXin (KEFLEX) capsule 500 mg (has no administration in time range)     Initial Impression / Assessment and Plan / ED Course  I have reviewed the triage vital signs and the nursing notes.  Pertinent labs & imaging results that were available  during my care of the patient were reviewed by me and considered in my medical decision making (see chart for details).  Nausea of uncertain cause, possible gastritis.  Suprapubic pain which does not appear to be serious based on exam and history.  Old records are reviewed, and she has multiple ED visits related to abdominal pain and diarrhea.  I suspect underlying irritable bowel syndrome.  CBC is and metabolic panel are unremarkable.  Urinalysis suggests possible cystitis with 11-20 WBCs.  This is consistent with her history.  Will give ondansetron to see if can get dramatic relief of nausea, start on cephalexin.  She had good relief of nausea.  She is discharged with prescription for metoclopramide and cephalexin.  Return precautions discussed.  Final Clinical Impressions(s) / ED Diagnoses   Final diagnoses:  Nausea  Cystitis    ED Discharge Orders        Ordered    cephALEXin (KEFLEX) 500 MG capsule  3 times daily     01/01/18 0111    metoCLOPramide (REGLAN) 10 MG tablet  Every 6 hours PRN     01/01/18 0111       Dione BoozeGlick, Davida Falconi, MD 01/01/18 0113

## 2018-01-01 NOTE — Discharge Instructions (Addendum)
Return if symptoms are getting worse. °

## 2018-01-14 ENCOUNTER — Encounter (HOSPITAL_COMMUNITY): Payer: Self-pay

## 2018-01-14 ENCOUNTER — Inpatient Hospital Stay (HOSPITAL_COMMUNITY)
Admission: AD | Admit: 2018-01-14 | Discharge: 2018-01-14 | Disposition: A | Discharge status: Home / Self Care | Payer: Self-pay | Source: Ambulatory Visit | Attending: Obstetrics & Gynecology | Admitting: Obstetrics & Gynecology

## 2018-01-14 DIAGNOSIS — F17210 Nicotine dependence, cigarettes, uncomplicated: Secondary | ICD-10-CM | POA: Insufficient documentation

## 2018-01-14 DIAGNOSIS — I10 Essential (primary) hypertension: Secondary | ICD-10-CM | POA: Insufficient documentation

## 2018-01-14 DIAGNOSIS — D573 Sickle-cell trait: Secondary | ICD-10-CM | POA: Insufficient documentation

## 2018-01-14 DIAGNOSIS — N911 Secondary amenorrhea: Secondary | ICD-10-CM

## 2018-01-14 DIAGNOSIS — Z8249 Family history of ischemic heart disease and other diseases of the circulatory system: Secondary | ICD-10-CM | POA: Insufficient documentation

## 2018-01-14 HISTORY — DX: Chlamydial infection, unspecified: A74.9

## 2018-01-14 HISTORY — DX: Gonococcal infection, unspecified: A54.9

## 2018-01-14 LAB — CBC
HEMATOCRIT: 37.7 % (ref 36.0–46.0)
HEMOGLOBIN: 12.9 g/dL (ref 12.0–15.0)
MCH: 30.1 pg (ref 26.0–34.0)
MCHC: 34.2 g/dL (ref 30.0–36.0)
MCV: 88.1 fL (ref 78.0–100.0)
Platelets: 218 10*3/uL (ref 150–400)
RBC: 4.28 MIL/uL (ref 3.87–5.11)
RDW: 13.2 % (ref 11.5–15.5)
WBC: 10.1 10*3/uL (ref 4.0–10.5)

## 2018-01-14 LAB — TSH: TSH: 1.056 u[IU]/mL (ref 0.350–4.500)

## 2018-01-14 LAB — URINALYSIS, ROUTINE W REFLEX MICROSCOPIC
Bilirubin Urine: NEGATIVE
GLUCOSE, UA: NEGATIVE mg/dL
Ketones, ur: NEGATIVE mg/dL
LEUKOCYTES UA: NEGATIVE
Nitrite: NEGATIVE
PH: 8 (ref 5.0–8.0)
Protein, ur: NEGATIVE mg/dL
RBC / HPF: >50 RBC/hpf — ABNORMAL HIGH (ref 0–5)
SPECIFIC GRAVITY, URINE: 1.013 (ref 1.005–1.030)

## 2018-01-14 LAB — POCT PREGNANCY, URINE: Preg Test, Ur: NEGATIVE

## 2018-01-14 MED ORDER — MEDROXYPROGESTERONE ACETATE 10 MG PO TABS: 10.0000 mg | ORAL_TABLET | Freq: Every day | ORAL | 2 refills | Status: DC

## 2018-01-14 NOTE — MAU Provider Note (Signed)
Chief Complaint:  Vaginal Bleeding   First Provider Initiated Contact with Patient 01/14/18 2109      HPI: Pamela Patterson is a 22 y.o. G1P0010 who presents to maternity admissions reporting spotting today after not having a period for 6 months.  Has some cramps too.  UPTs neg.. She reports vaginal bleeding, no vaginal itching/burning, urinary symptoms, h/a, dizziness, n/v, or fever/chills.   States has been seen in ER for this "and no one did anything to find out what was wrong".  Wants to know what is going on.    Vaginal Bleeding  The patient's primary symptoms include pelvic pain and vaginal bleeding. The patient's pertinent negatives include no genital itching, genital lesions, genital odor or vaginal discharge. This is a new problem. The current episode started today. The problem occurs intermittently. The problem has been unchanged. The pain is mild. She is not pregnant. Pertinent negatives include no back pain, chills, constipation, diarrhea, dysuria, fever, nausea or vomiting. The vaginal discharge was bloody. The vaginal bleeding is spotting. She has not been passing clots. She has not been passing tissue. Nothing aggravates the symptoms. She has tried nothing for the symptoms. She uses nothing for contraception. Her menstrual history has been irregular.   RN Note: Pt here with c/o spotting today. Haven't had a period since December. Started spotting today. Has had multiple pregnancy tests, all negative. Not on birth control.     Past Medical History: Past Medical History:  Diagnosis Date  . Chlamydia   . Gonorrhea   . Hypertension   . Sickle cell trait (HCC)     Past obstetric history: OB History  Gravida Para Term Preterm AB Living  1       1    SAB TAB Ectopic Multiple Live Births    1          # Outcome Date GA Lbr Len/2nd Weight Sex Delivery Anes PTL Lv  1 TAB             Obstetric Comments  Pt denies being pregnant    Past Surgical History: Past Surgical History:   Procedure Laterality Date  . ADENOIDECTOMY    . TONSILLECTOMY      Family History: Family History  Problem Relation Age of Onset  . Hypertension Mother   . Stroke Maternal Grandmother     Social History: Social History   Tobacco Use  . Smoking status: Current Some Day Smoker    Packs/day: 0.00    Types: Cigarettes  . Smokeless tobacco: Never Used  . Tobacco comment: pt denies  Substance Use Topics  . Alcohol use: Not Currently    Comment: rarely  . Drug use: No    Types: Marijuana    Allergies: No Known Allergies  Meds:  No medications prior to admission.    I have reviewed patient's Past Medical Hx, Surgical Hx, Family Hx, Social Hx, medications and allergies.  ROS:  Review of Systems  Constitutional: Negative for chills and fever.  Gastrointestinal: Negative for constipation, diarrhea, nausea and vomiting.  Genitourinary: Positive for pelvic pain and vaginal bleeding. Negative for dysuria and vaginal discharge.  Musculoskeletal: Negative for back pain.   Other systems negative     Physical Exam   Patient Vitals for the past 24 hrs:  BP Temp Temp src Pulse Resp SpO2 Height Weight  01/14/18 2226 (!) 138/95 - - 85 16 - - -  01/14/18 1959 (!) 141/96 98.8 F (37.1 C) Oral (!) 109 18 99 %  5\' 3"  (1.6 m) 217 lb (98.4 kg)   Constitutional: Well-developed, well-nourished female in no acute distress.  Cardiovascular: normal rate and rhythm, no ectopy audible, S1 & S2 heard, no murmur Respiratory: normal effort, no distress. Lungs CTAB with no wheezes or crackles GI: Abd soft, non-tender.  Nondistended.  No rebound, No guarding.  Bowel Sounds audible  MS: Extremities nontender, no edema, normal ROM Neurologic: Alert and oriented x 4.   Grossly nonfocal. GU: Neg CVAT. Skin:  Warm and Dry Psych:  Affect appropriate.  PELVIC EXAM: Cervix pink, visually closed, without lesion, scant red discharge, vaginal walls and external genitalia normal Bimanual exam: Cervix  firm, anterior, neg CMT, uterus nontender, nonenlarged, adnexa without tenderness, enlargement, or mass  Difficult exam due to habitus.    Labs: Results for orders placed or performed during the hospital encounter of 01/14/18 (from the past 24 hour(s))  Urinalysis, Routine w reflex microscopic     Status: Abnormal   Collection Time: 01/14/18  8:13 PM  Result Value Ref Range   Color, Urine YELLOW YELLOW   APPearance CLEAR CLEAR   Specific Gravity, Urine 1.013 1.005 - 1.030   pH 8.0 5.0 - 8.0   Glucose, UA NEGATIVE NEGATIVE mg/dL   Hgb urine dipstick LARGE (A) NEGATIVE   Bilirubin Urine NEGATIVE NEGATIVE   Ketones, ur NEGATIVE NEGATIVE mg/dL   Protein, ur NEGATIVE NEGATIVE mg/dL   Nitrite NEGATIVE NEGATIVE   Leukocytes, UA NEGATIVE NEGATIVE   RBC / HPF >50 (H) 0 - 5 RBC/hpf   WBC, UA 11-20 0 - 5 WBC/hpf   Bacteria, UA RARE (A) NONE SEEN   Squamous Epithelial / LPF 0-5 0 - 5  Pregnancy, urine POC     Status: None   Collection Time: 01/14/18  8:17 PM  Result Value Ref Range   Preg Test, Ur NEGATIVE NEGATIVE  TSH     Status: None   Collection Time: 01/14/18  9:56 PM  Result Value Ref Range   TSH 1.056 0.350 - 4.500 uIU/mL  CBC     Status: None   Collection Time: 01/14/18  9:56 PM  Result Value Ref Range   WBC 10.1 4.0 - 10.5 K/uL   RBC 4.28 3.87 - 5.11 MIL/uL   Hemoglobin 12.9 12.0 - 15.0 g/dL   HCT 16.137.7 09.636.0 - 04.546.0 %   MCV 88.1 78.0 - 100.0 fL   MCH 30.1 26.0 - 34.0 pg   MCHC 34.2 30.0 - 36.0 g/dL   RDW 40.913.2 81.111.5 - 91.415.5 %   Platelets 218 150 - 400 K/uL      Imaging:  No results found.  MAU Course/MDM: I have ordered labs as follows:  CBC, TSH and Prolactin.   Imaging ordered: Outpatient pelvic ultrasound Results reviewed. Labs are normal  No pregnancy.   Consult Dr Despina HiddenEure, he recommends TSH, PRL, US and Provera challenge with f/u in office.   Discussed secondary amenorrhea with pt.  Discussed this can be a long and involved evaluation.  Most likely this represents PCOS  (which has been mentioned to her before).  Recommend provera challenge first.  Treatments in MAU included none.   Pt stable at time of discharge.  Assessment: 1. Secondary amenorrhea     Plan: Discharge home Recommend Provera challenge, preliminary testing and followup in office  Rx sent for Provera  for 10 day challenge test. Informed will bleed after last pill (7-10 days) and may be heavy. TSH and Prolactin drawn  Follow-up Information  [facility] Schedule an appointment as soon as possible for a visit.   Specialty:  Obstetrics and Gynecology Why:  someone will call  Contact information: [address] [place] [address] [phone]          Encouraged to return here or to other Urgent Care/ED if she develops worsening of symptoms, increase in pain, fever, or other concerning symptoms.   Wynelle Bourgeois CNM, MSN Certified Nurse-Midwife 01/15/2018 2:17 AM

## 2018-01-14 NOTE — MAU Note (Signed)
Pt here with c/o spotting today. Haven't had a period since December. Started spotting today. Has had multiple pregnancy tests, all negative. Not on birth control.

## 2018-01-14 NOTE — Discharge Instructions (Signed)
Prolactin Level Test Why am I having this test? Prolactin is a hormone that is produced by the pituitary gland. This test is often used to diagnose and monitor disease problems in the pituitary gland. Prolactin levels go up and down (fluctuate) due to stress, illness, trauma, or surgery. Increased levels are commonly associated with an absent menstrual cycle (amenorrhea) and tumors. What kind of sample is taken? A blood sample is required for this test. It is usually collected by inserting a needle into a vein. How do I prepare for this test? There is no preparation required for this test. What are the reference ranges? Reference ranges are considered healthy ranges established after testing a large group of healthy people. Reference ranges may vary among different people, labs, and hospitals. It is your responsibility to obtain your test results. Ask the lab or department performing the test when and how you will get your results.  Adult female: less than 20 ng/mL.  Adult female: less than 28 ng/mL.  Pregnant female: ? Trimester 1: less than 80 ng/mL. ? Trimester 2: less than 160 ng/mL. ? Trimester 3: less than 400 ng/mL.  What do the results mean? Increased levels of prolactin may indicate:  A pituitary gland tumor.  Amenorrhea.  Hypothyroidism.  Certain pituitary or reproductive syndromes.  Kidney failure.  Decreased levels of prolactin may indicate:  Lack of blood to the pituitary gland.  Pituitary gland failure.  Talk with your health care provider to discuss your results, treatment options, and if necessary, the need for more tests. Talk with your health care provider if you have any questions about your results. Talk with your health care provider to discuss your results, treatment options, and if necessary, the need for more tests. Talk with your health care provider if you have any questions about your results. This information is not intended to replace advice given to  you by your health care provider. Make sure you discuss any questions you have with your health care provider. Document Released: 08/10/2004 Document Revised: 03/13/2016 Document Reviewed: 12/31/2013 Elsevier Interactive Patient Education  2018 [employer].  Secondary Amenorrhea Secondary amenorrhea is the stopping of menstrual flow for 3-6 months in a female who has previously had periods. There are many possible causes. Most of these causes are not serious. Usually, treating the underlying problem causing the loss of menses will return your periods to normal. What are the causes? Some common and uncommon causes of not menstruating include:  Malnutrition.  Low blood sugar (hypoglycemia).  Polycystic ovary disease.  Stress or fear.  Breastfeeding.  Hormone imbalance.  Ovarian failure.  Medicines.  Extreme obesity.  Cystic fibrosis.  Low body weight or drastic weight reduction from any cause.  Early menopause.  Removal of ovaries or uterus.  Contraceptives.  Illness.  Long-term (chronic) illnesses.  Cushing syndrome.  Thyroid problems.  Birth control pills, patches, or vaginal rings for birth control.  What increases the risk? You may be at greater risk of secondary amenorrhea if:  You have a family history of this condition.  You have an eating disorder.  You do athletic training.  How is this diagnosed? A diagnosis is made by your health care provider taking a medical history and doing a physical exam. This will include a pelvic exam to check for problems with your reproductive organs. Pregnancy must be ruled out. Often, numerous blood tests are done to measure different hormones in the body. Urine testing may be done. Specialized exams (ultrasound, CT scan, MRI,  or hysteroscopy) may have to be done as well as measuring the body mass index (BMI). How is this treated? Treatment depends on the cause of the amenorrhea. If an eating disorder is present, this  can be treated with an adequate diet and therapy. Chronic illnesses may improve with treatment of the illness. Amenorrhea may be corrected with medicines, lifestyle changes, or surgery. If the amenorrhea cannot be corrected, it is sometimes possible to create a false menstruation with medicines. Follow these instructions at home:  Maintain a healthy diet.  Manage weight problems.  Exercise regularly but not excessively.  Get adequate sleep.  Manage stress.  Be aware of changes in your menstrual cycle. Keep a record of when your periods occur. Note the date your period starts, how long it lasts, and any problems. Contact a health care provider if: Your symptoms do not get better with treatment. This information is not intended to replace advice given to you by your health care provider. Make sure you discuss any questions you have with your health care provider. Document Released: 08/19/2006 Document Revised: 12/14/2015 Document Reviewed: 12/24/2012 Elsevier Interactive Patient Education  2018 [employer]  Abdominal or Pelvic Ultrasound An ultrasound is a test that takes pictures of the inside of the body. An abdominal ultrasound takes pictures of your belly. A pelvic ultrasound takes pictures of the area between your belly and thighs. An ultrasound may be done to check an organ or look for problems. If you are pregnant, it may be done to learn about your baby. What happens before the procedure?  Follow instructions from your doctor about what you cannot eat or drink.  Wear clothing that is easy to wash. Gel from the test might get on your clothes. What happens during the procedure?  A gel will be put on your skin. It may feel cool.  A wand called a transducer will be put on your skin.  The wand will take pictures. They will show on small TV screens. What happens after the procedure?  Get your test results. Ask your doctor or the department that did the test when your results  will be ready.  Keep follow-up visits as told by your doctor. This is important. This information is not intended to replace advice given to you by your health care provider. Make sure you discuss any questions you have with your health care provider. Document Released: 08/10/2010 Document Revised: 03/07/2016 Document Reviewed: 03/31/2015 Elsevier Interactive Patient Education  2018 [employer] Thyroid-Stimulating Hormone Test Why am I having this test? A thyroid-stimulating hormone (TSH) test is a blood test that is done to measure the level of TSH, also known as thyrotropin, in your blood. TSH is produced by the pituitary gland. The pituitary gland is a small organ located just below the brain, behind your eyes and nasal passages. It is part of a system that monitors and maintains thyroid hormone levels and thyroid gland function. Thyroid hormones affect many body parts and systems, including the system that affects how quickly your body burns fuel for energy. Your health care provider may recommend testing your TSH level if you have signs and symptoms of abnormal thyroid hormone levels. Knowing the level of TSH in your blood can help your health care provider:  Diagnose a thyroid gland or pituitary gland disorder.  Manage your condition and treatment if you have hypothyroidism or hyperthyroidism.  What kind of sample is taken? A blood sample is required for this test. It is usually collected by  inserting a needle into a vein. How do I prepare for this test? There is no preparation required for this test. What are the reference ranges? Reference rangesare considered healthy rangesestablished after testing a large group of healthy people. Reference rangesmay vary among different people, labs, and hospitals. It is your responsibility to obtain your test results. Ask the lab or department performing the test when and how you will get your results. Range of Normal Values:  Adult: 0.3-5  microunits/mL or 0.3-5 milliunits/L (SI units).  Newborn: 3-18 microunits/mL or 3-18 milliunits/L.  Cord: 3-12 microunits/mL or 3-12 milliunits/L.  What do the results mean? A high level of TSH may mean:  Your thyroid gland is not making enough thyroid hormones. When the thyroid gland does not make enough thyroid hormones, the pituitary gland releases TSH into the bloodstream. The higher-than-normal levels of TSH prompt the thyroid gland to release more thyroid hormones.  You are getting an insufficient level of thyroid hormone medicine, if you are receiving this type of treatment.  There is a problem with the pituitary gland (rare).  A low level of TSH can indicate a problem with the pituitary gland. Talk with your health care provider to discuss your results, treatment options, and if necessary, the need for more tests. Talk with your health care provider if you have any questions about your results. Talk with your health care provider to discuss your results, treatment options, and if necessary, the need for more tests. Talk with your health care provider if you have any questions about your results. This information is not intended to replace advice given to you by your health care provider. Make sure you discuss any questions you have with your health care provider. Document Released: 08/02/2004 Document Revised: 03/10/2016 Document Reviewed: 12/01/2013 Elsevier Interactive Patient Education  2018 [employer]. Medroxyprogesterone tablets What is this medicine? MEDROXYPROGESTERONE (me DROX ee proe JES te rone) is a hormone in a class called progestins. It is commonly used to prevent the uterine lining from overgrowth in women taking an estrogen after menopause. It is also used to treat irregular menstrual bleeding or a lack of menstrual bleeding in women. This medicine may be used for other purposes; ask your health care provider or pharmacist if you have questions. COMMON BRAND  NAME(S): Amen, Provera What should I tell my health care provider before I take this medicine? They need to know if you have any of these conditions: -blood vessel disease or a history of a blood clot in the lungs or legs -breast, cervical or vaginal cancer -heart disease -kidney disease -liver disease -migraine -recent miscarriage or abortion -mental depression -migraine -seizures (convulsions) -stroke -vaginal bleeding that has not been evaluated -an unusual or allergic reaction to medroxyprogesterone, other medicines, foods, dyes, or preservatives -pregnant or trying to get pregnant -breast-feeding How should I use this medicine? Take this medicine by mouth with a glass of water. Follow the directions on the prescription label. Take your doses at regular intervals. Do not take your medicine more often than directed. Talk to your pediatrician regarding the use of this medicine in children. Special care may be needed. While this drug may be prescribed for children as young as 13 years for selected conditions, precautions do apply. Overdosage: If you think you have taken too much of this medicine contact a poison control center or emergency room at once. NOTE: This medicine is only for you. Do not share this medicine with others. What if I miss a dose? If  you miss a dose, take it as soon as you can. If it is almost time for your next dose, take only that dose. Do not take double or extra doses. What may interact with this medicine? -barbiturate medicines for inducing sleep or treating seizures (convulsions) -bosentan -carbamazepine -phenytoin -rifampin -St. John's Wort This list may not describe all possible interactions. Give your health care provider a list of all the medicines, herbs, non-prescription drugs, or dietary supplements you use. Also tell them if you smoke, drink alcohol, or use illegal drugs. Some items may interact with your medicine. What should I watch for while  using this medicine? Visit your health care professional for regular checks on your progress. You will need a regular breast and pelvic exam. If you have any reason to think you are pregnant, stop taking this medicine at once and contact your doctor or health care professional. What side effects may I notice from receiving this medicine? Side effects that you should report to your doctor or health care professional as soon as possible: -breast tenderness or discharge -changes in mood or emotions, such as depression -changes in vision or speech -pain in the abdomen, chest, groin, or leg -severe headache -skin rash, itching, or hives -sudden shortness of breath -unusually weak or tired -yellowing of skin or eyes Side effects that usually do not require medical attention (report to your doctor or health care professional if they continue or are bothersome): -acne -change in menstrual bleeding pattern or flow -changes in sexual desire -facial hair growth -fluid retention and swelling -headache -upset stomach -weight gain or loss This list may not describe all possible side effects. Call your doctor for medical advice about side effects. You may report side effects to FDA at 1-800-FDA-1088. Where should I keep my medicine? Keep out of the reach of children. Store at room temperature between 20 and 25 degrees C (68 and 77 degrees F). Throw away any unused medicine after the expiration date. NOTE: This sheet is a summary. It may not cover all possible information. If you have questions about this medicine, talk to your doctor, pharmacist, or health care provider.  2018 Elsevier/Gold Standard (2008-07-07 11:26:12)  Hypertension Hypertension, commonly called high blood pressure, is when the force of blood pumping through the arteries is too strong. The arteries are the blood vessels that carry blood from the heart throughout the body. Hypertension forces the heart to work harder to pump blood  and may cause arteries to become narrow or stiff. Having untreated or uncontrolled hypertension can cause heart attacks, strokes, kidney disease, and other problems. A blood pressure reading consists of a higher number over a lower number. Ideally, your blood pressure should be below 120/80. The first ("top") number is called the systolic pressure. It is a measure of the pressure in your arteries as your heart beats. The second ("bottom") number is called the diastolic pressure. It is a measure of the pressure in your arteries as the heart relaxes. What are the causes? The cause of this condition is not known. What increases the risk? Some risk factors for high blood pressure are under your control. Others are not. Factors you can change  Smoking.  Having type 2 diabetes mellitus, high cholesterol, or both.  Not getting enough exercise or physical activity.  Being overweight.  Having too much fat, sugar, calories, or salt (sodium) in your diet.  Drinking too much alcohol. Factors that are difficult or impossible to change  Having chronic kidney disease.  Having a family history of high blood pressure.  Age. Risk increases with age.  Race. You may be at higher risk if you are African-American.  Gender. Men are at higher risk than women before age 67. After age 26, women are at higher risk than men.  Having obstructive sleep apnea.  Stress. What are the signs or symptoms? Extremely high blood pressure (hypertensive crisis) may cause:  Headache.  Anxiety.  Shortness of breath.  Nosebleed.  Nausea and vomiting.  Severe chest pain.  Jerky movements you cannot control (seizures).  How is this diagnosed? This condition is diagnosed by measuring your blood pressure while you are seated, with your arm resting on a surface. The cuff of the blood pressure monitor will be placed directly against the skin of your upper arm at the level of your heart. It should be measured at  least twice using the same arm. Certain conditions can cause a difference in blood pressure between your right and left arms. Certain factors can cause blood pressure readings to be lower or higher than normal (elevated) for a short period of time:  When your blood pressure is higher when you are in a health care provider's office than when you are at home, this is called white coat hypertension. Most people with this condition do not need medicines.  When your blood pressure is higher at home than when you are in a health care provider's office, this is called masked hypertension. Most people with this condition may need medicines to control blood pressure.  If you have a high blood pressure reading during one visit or you have normal blood pressure with other risk factors:  You may be asked to return on a different day to have your blood pressure checked again.  You may be asked to monitor your blood pressure at home for 1 week or longer.  If you are diagnosed with hypertension, you may have other blood or imaging tests to help your health care provider understand your overall risk for other conditions. How is this treated? This condition is treated by making healthy lifestyle changes, such as eating healthy foods, exercising more, and reducing your alcohol intake. Your health care provider may prescribe medicine if lifestyle changes are not enough to get your blood pressure under control, and if:  Your systolic blood pressure is above 130.  Your diastolic blood pressure is above 80.  Your personal target blood pressure may vary depending on your medical conditions, your age, and other factors. Follow these instructions at home: Eating and drinking  Eat a diet that is high in fiber and potassium, and low in sodium, added sugar, and fat. An example eating plan is called the DASH (Dietary Approaches to Stop Hypertension) diet. To eat this way: ? Eat plenty of fresh fruits and vegetables.  Try to fill half of your plate at each meal with fruits and vegetables. ? Eat whole grains, such as whole wheat pasta, brown rice, or whole grain bread. Fill about one quarter of your plate with whole grains. ? Eat or drink low-fat dairy products, such as skim milk or low-fat yogurt. ? Avoid fatty cuts of meat, processed or cured meats, and poultry with skin. Fill about one quarter of your plate with lean proteins, such as fish, chicken without skin, beans, eggs, and tofu. ? Avoid premade and processed foods. These tend to be higher in sodium, added sugar, and fat.  Reduce your daily sodium intake. Most people with hypertension should eat  less than 1,500 mg of sodium a day.  Limit alcohol intake to no more than 1 drink a day for nonpregnant women and 2 drinks a day for men. One drink equals 12 oz of beer, 5 oz of wine, or 1 oz of hard liquor. Lifestyle  Work with your health care provider to maintain a healthy body weight or to lose weight. Ask what an ideal weight is for you.  Get at least 30 minutes of exercise that causes your heart to beat faster (aerobic exercise) most days of the week. Activities may include walking, swimming, or biking.  Include exercise to strengthen your muscles (resistance exercise), such as pilates or lifting weights, as part of your weekly exercise routine. Try to do these types of exercises for 30 minutes at least 3 days a week.  Do not use any products that contain nicotine or tobacco, such as cigarettes and e-cigarettes. If you need help quitting, ask your health care provider.  Monitor your blood pressure at home as told by your health care provider.  Keep all follow-up visits as told by your health care provider. This is important. Medicines  Take over-the-counter and prescription medicines only as told by your health care provider. Follow directions carefully. Blood pressure medicines must be taken as prescribed.  Do not skip doses of blood pressure  medicine. Doing this puts you at risk for problems and can make the medicine less effective.  Ask your health care provider about side effects or reactions to medicines that you should watch for. Contact a health care provider if:  You think you are having a reaction to a medicine you are taking.  You have headaches that keep coming back (recurring).  You feel dizzy.  You have swelling in your ankles.  You have trouble with your vision. Get help right away if:  You develop a severe headache or confusion.  You have unusual weakness or numbness.  You feel faint.  You have severe pain in your chest or abdomen.  You vomit repeatedly.  You have trouble breathing. Summary  Hypertension is when the force of blood pumping through your arteries is too strong. If this condition is not controlled, it may put you at risk for serious complications.  Your personal target blood pressure may vary depending on your medical conditions, your age, and other factors. For most people, a normal blood pressure is less than 120/80.  Hypertension is treated with lifestyle changes, medicines, or a combination of both. Lifestyle changes include weight loss, eating a healthy, low-sodium diet, exercising more, and limiting alcohol. This information is not intended to replace advice given to you by your health care provider. Make sure you discuss any questions you have with your health care provider. Document Released: 07/08/2005 Document Revised: 06/05/2016 Document Reviewed: 06/05/2016 Elsevier Interactive Patient Education  [employer].

## 2018-01-16 LAB — PROLACTIN: Prolactin: 10.3 ng/mL (ref 4.8–23.3)

## 2018-01-21 ENCOUNTER — Encounter (HOSPITAL_COMMUNITY): Payer: Self-pay

## 2018-01-21 ENCOUNTER — Ambulatory Visit (HOSPITAL_COMMUNITY)
Admission: RE | Admit: 2018-01-21 | Discharge: 2018-01-21 | Disposition: A | Discharge status: Home / Self Care | Payer: Self-pay | Source: Ambulatory Visit | Attending: Advanced Practice Midwife | Admitting: Advanced Practice Midwife

## 2018-01-21 DIAGNOSIS — N911 Secondary amenorrhea: Secondary | ICD-10-CM | POA: Insufficient documentation

## 2018-01-23 ENCOUNTER — Telehealth: Payer: Self-pay | Admitting: General Practice

## 2018-01-23 NOTE — Telephone Encounter (Signed)
Called patient & informed her of normal ultrasound results. Patient verbalized understanding & had no questions.

## 2018-02-23 ENCOUNTER — Encounter: Payer: Self-pay | Admitting: Family Medicine

## 2018-02-23 ENCOUNTER — Encounter: Payer: Self-pay | Admitting: Obstetrics and Gynecology

## 2018-02-23 ENCOUNTER — Ambulatory Visit (INDEPENDENT_AMBULATORY_CARE_PROVIDER_SITE_OTHER): Payer: Self-pay | Admitting: Obstetrics and Gynecology

## 2018-02-23 VITALS — BP 134/84 | HR 102 | Ht 63.5 in | Wt 217.8 lb

## 2018-02-23 DIAGNOSIS — N912 Amenorrhea, unspecified: Secondary | ICD-10-CM

## 2018-02-23 DIAGNOSIS — Z01419 Encounter for gynecological examination (general) (routine) without abnormal findings: Secondary | ICD-10-CM

## 2018-02-23 NOTE — Progress Notes (Signed)
GYNECOLOGY ANNUAL PREVENTATIVE CARE ENCOUNTER NOTE  Subjective:   Pamela Patterson is a 22 y.o. G48P0010 female here for a annual gynecologic exam. Current complaints: irregular bleeding.    LMP 07/15/18, did not have a period until 01/09/18 and she spotted from 01/09/18 until 02/21/18. Prior to January, she was having irregular periods. Sometimes, they are monthly, sometimes she will skip a month. Has never skipped more than 2 months before. Periods last 4 days, are light. Denies cramping. Not currently on anything for contraception.  Was on OCPs starting age 41 for 1 to 2 years to regulate periods but it didn't help. Would like to get pregnant in near future.   Had pap age 59, was normal, none since. Desires STI testing.    Gynecologic History No LMP recorded. (Menstrual status: Irregular Periods). Contraception: none Last Pap: age 72 Results were: normal Gardisil: has received  Obstetric History OB History  Gravida Para Term Preterm AB Living  1       1    SAB TAB Ectopic Multiple Live Births    1          # Outcome Date GA Lbr Len/2nd Weight Sex Delivery Anes PTL Lv  1 TAB             Obstetric Comments  Pt denies h/o of ever being pregnant    Past Medical History:  Diagnosis Date  . Chlamydia   . Gonorrhea   . Hypertension   . Sickle cell trait [facility])     Past Surgical History:  Procedure Laterality Date  . ADENOIDECTOMY    . TONSILLECTOMY      Current Outpatient Medications on File Prior to Visit  Medication Sig Dispense Refill  . cephALEXin (KEFLEX) 500 MG capsule Take 1 capsule (500 mg total) by mouth 3 (three) times daily. (Patient not taking: Reported on 02/23/2018) 15 capsule 0   No current facility-administered medications on file prior to visit.     No Known Allergies  Social History   Socioeconomic History  . Marital status: Single    Spouse name: Not on file  . Number of children: Not on file  . Years of education: Not on file  . Highest education  level: Not on file  Occupational History  . Not on file  Social Needs  . Financial resource strain: Not on file  . Food insecurity:    Worry: Not on file    Inability: Not on file  . Transportation needs:    Medical: Not on file    Non-medical: Not on file  Tobacco Use  . Smoking status: Former Smoker    Packs/day: 0.00    Types: Cigarettes  . Smokeless tobacco: Never Used  . Tobacco comment: pt denies  Substance and Sexual Activity  . Alcohol use: Not Currently    Comment: rarely  . Drug use: No    Comment: h/o of MJ, no longer  . Sexual activity: Yes    Comment: occasionally  Lifestyle  . Physical activity:    Days per week: Not on file    Minutes per session: Not on file  . Stress: Not on file  Relationships  . Social connections:    Talks on phone: Not on file    Gets together: Not on file    Attends religious service: Not on file    Active member of club or organization: Not on file    Attends meetings of clubs or organizations: Not on file  Relationship status: Not on file  . Intimate partner violence:    Fear of current or ex partner: Not on file    Emotionally abused: Not on file    Physically abused: Not on file    Forced sexual activity: Not on file  Other Topics Concern  . Not on file  Social History Narrative  . Not on file    Family History  Problem Relation Age of Onset  . Hypertension Mother   . Stroke Maternal Grandmother     The following portions of the patient's history were reviewed and updated as appropriate: allergies, current medications, past family history, past medical history, past social history, past surgical history and problem list.  Review of Systems Pertinent items are noted in HPI.   Objective:  BP 134/84   Pulse (!) 102   Ht 5' 3.5" (1.613 m)   Wt 217 lb 12.8 oz (98.8 kg)   BMI 37.98 kg/m  CONSTITUTIONAL: Well-developed, well-nourished female in no acute distress.  HENT:  Normocephalic, atraumatic, External right  and left ear normal. Oropharynx is clear and moist EYES: Conjunctivae and EOM are normal. Pupils are equal, round, and reactive to light. No scleral icterus.  NECK: Normal range of motion, supple, no masses.  Normal thyroid.  SKIN: Skin is warm and dry. No rash noted. Not diaphoretic. No erythema. No pallor. NEUROLOGIC: Alert and oriented to person, place, and time. Normal reflexes, muscle tone coordination. No cranial nerve deficit noted. PSYCHIATRIC: Normal mood and affect. Normal behavior. Normal judgment and thought content. CARDIOVASCULAR: Normal heart rate noted, regular rhythm RESPIRATORY: Clear to auscultation bilaterally. Effort and breath sounds normal, no problems with respiration noted. BREASTS: Symmetric in size. No masses, skin changes, nipple drainage, or lymphadenopathy. ABDOMEN: Soft, normal bowel sounds, no distention noted.  No tenderness, rebound or guarding.  PELVIC: Normal appearing external genitalia; normal appearing vaginal mucosa and cervix.  No abnormal discharge noted.   Normal uterine size, no other palpable masses, no uterine or adnexal tenderness. MUSCULOSKELETAL: Normal range of motion. No tenderness.  No cyanosis, clubbing, or edema.  2+ distal pulses.   Assessment and Plan:   1. Well female exam with routine gynecological exam No insurance - patient to get pap at pap clinic No insurance - patient to [facility]  2. Amenorrhea H/o irregular periods, have never been monthly/regular, no formal diagnosis in past but PCOS has been mentioned to her, TSH/prolactin wnl Has been spotting for one month, recently stopped Did not do provera challenge - FSH - Estradiol - TestT+TestF+SHBG Will f/u for results, reviewed next steps would be pending results of these tests  Will follow up results of lab work and manage accordingly. Encouraged improvement in diet and exercise.  Gardisil n/a  Routine preventative health maintenance measures  emphasized. Please refer to After Visit Summary for other counseling recommendations.     Baldemar LenisK. Meryl Chidera Dearcos, M.D. [facility]

## 2018-02-23 NOTE — Patient Instructions (Addendum)
Call [phone] to schedule your pap and mammogram.  [facility] to schedule your sexually transmitted infection screening [phone].

## 2018-02-23 NOTE — Progress Notes (Signed)
Pt states was spotting from 06/20-08/03.

## 2018-02-25 LAB — TESTT+TESTF+SHBG
SEX HORMONE BINDING: 28.3 nmol/L (ref 24.6–122.0)
Testosterone, Free: 3 pg/mL (ref 0.0–4.2)
Testosterone, total: 57.8 ng/dL — ABNORMAL HIGH (ref 10.0–55.0)

## 2018-02-25 LAB — ESTRADIOL: ESTRADIOL: 64.8 pg/mL

## 2018-02-25 LAB — FOLLICLE STIMULATING HORMONE: FSH: 6.8 m[IU]/mL

## 2018-03-18 ENCOUNTER — Other Ambulatory Visit: Payer: Self-pay | Admitting: Obstetrics and Gynecology

## 2018-03-18 DIAGNOSIS — N939 Abnormal uterine and vaginal bleeding, unspecified: Secondary | ICD-10-CM

## 2018-05-05 ENCOUNTER — Ambulatory Visit (INDEPENDENT_AMBULATORY_CARE_PROVIDER_SITE_OTHER): Payer: Self-pay | Admitting: *Deleted

## 2018-05-05 ENCOUNTER — Inpatient Hospital Stay (HOSPITAL_COMMUNITY)
Admission: AD | Admit: 2018-05-05 | Discharge: 2018-05-05 | Disposition: A | Discharge status: Home / Self Care | Payer: Self-pay | Source: Ambulatory Visit | Attending: Obstetrics and Gynecology | Admitting: Obstetrics and Gynecology

## 2018-05-05 ENCOUNTER — Other Ambulatory Visit: Payer: Self-pay

## 2018-05-05 DIAGNOSIS — N912 Amenorrhea, unspecified: Secondary | ICD-10-CM

## 2018-05-05 DIAGNOSIS — E282 Polycystic ovarian syndrome: Secondary | ICD-10-CM | POA: Insufficient documentation

## 2018-05-05 DIAGNOSIS — Z3202 Encounter for pregnancy test, result negative: Secondary | ICD-10-CM

## 2018-05-05 LAB — POCT PREGNANCY, URINE: Preg Test, Ur: NEGATIVE

## 2018-05-05 NOTE — Progress Notes (Signed)
Pt informed of negative UPT today. She has not had sx of pregnancy except for continued amenorrhea. Pt had office visit w/Dr. Earlene Plater on 8/5 due to this problem. Per chart review, a MyChart message was sent to pt by Dr. Earlene Plater on 8/28 which provided lab result information from 8/5 as well as recommendation for follow up fasting lab needed (order has been placed). Pt did not receive the message because her e-mail address has changed. Pt was informed of recommendation from Dr. Earlene Plater and agrees to lab appt on 10/22 @ 0850. She will have office follow up visit on 11/11. Pt was also given the contact number for the MyChart help desk so her new e-mail can be linked to her account. She voiced understanding of all information and instructions given.

## 2018-05-05 NOTE — MAU Provider Note (Signed)
[facility]N MAU Note  S:   22 y.o. G1P0010 who presents to MAU for pregnancy confirmation or PCOS workup.  Specifically requesting abdominal ultrasound. She denies abdominal pain or vaginal bleeding today.    O: BP (!) 154/102   Pulse (!) 120   Resp 16   LMP 07/15/2017  Physical Examination: General appearance - alert, well appearing, and in no distress, oriented to person, place, and time and acyanotic, in no respiratory distress  No results found for this or any previous visit (from the past 48 hour(s)).  A: Missed menses Patient previously diagnosed with probable PCOS, see note from Dr Earlene Plater August 2019  P: D/C home Informed pt we do not do pregnancy verifications in MAU Referred to [facility] for pregnancy test & verification Walked patient to [facility]  Return to MAU as needed for pregnancy related emergencies  Calvert Cantor, CNM 2:29 PM

## 2018-05-05 NOTE — MAU Note (Signed)
Pt presents to MAU with complaints of lower abdominal cramping and states that she is [redacted] weeks pregnant. States that she has  not had a cycle since 07/15/18.

## 2018-05-05 NOTE — MAU Note (Signed)
Knox Saliva CNM in with pt discussing plan of care, pt went to clinic for pregnancy confirmation

## 2018-05-12 ENCOUNTER — Other Ambulatory Visit: Payer: Self-pay

## 2018-05-12 DIAGNOSIS — N939 Abnormal uterine and vaginal bleeding, unspecified: Secondary | ICD-10-CM

## 2018-05-14 LAB — 17-HYDROXYPROGESTERONE: 17 HYDROXYPROGESTERONE: 55 ng/dL

## 2018-05-25 ENCOUNTER — Ambulatory Visit (INDEPENDENT_AMBULATORY_CARE_PROVIDER_SITE_OTHER): Payer: Self-pay | Admitting: *Deleted

## 2018-05-25 DIAGNOSIS — N912 Amenorrhea, unspecified: Secondary | ICD-10-CM

## 2018-05-25 NOTE — Progress Notes (Signed)
I have reviewed this chart and agree with the RN/CMA assessment and management.    K. Meryl Davis, M.D. [facility]  

## 2018-05-25 NOTE — Progress Notes (Signed)
Patient states she came in today because she has been having a little spotting off an on, mostly just when she urinates but thought she should come in today in case anything needed to be done while she is spotting. I explained there is nothing to be done today because the Doctor has already ordered blood work to have results for her visit in one week and it won't matter if she is spotting or not. I did encourage to continue to keep record of her bleeding since she is being seen for amenorrhea/ PCOS. She states no period since December ; only spotting. She voices understanding.

## 2018-06-01 ENCOUNTER — Encounter: Payer: Self-pay | Admitting: Obstetrics and Gynecology

## 2018-06-01 ENCOUNTER — Ambulatory Visit (INDEPENDENT_AMBULATORY_CARE_PROVIDER_SITE_OTHER): Payer: Self-pay | Admitting: Obstetrics and Gynecology

## 2018-06-01 VITALS — BP 121/81 | HR 84 | Wt 216.1 lb

## 2018-06-01 DIAGNOSIS — E282 Polycystic ovarian syndrome: Secondary | ICD-10-CM

## 2018-06-01 DIAGNOSIS — R319 Hematuria, unspecified: Secondary | ICD-10-CM

## 2018-06-01 LAB — POCT URINALYSIS DIP (DEVICE)
BILIRUBIN URINE: NEGATIVE
Glucose, UA: NEGATIVE mg/dL
KETONES UR: NEGATIVE mg/dL
Nitrite: NEGATIVE
PH: 6 (ref 5.0–8.0)
Protein, ur: NEGATIVE mg/dL
SPECIFIC GRAVITY, URINE: 1.02 (ref 1.005–1.030)
Urobilinogen, UA: 0.2 mg/dL (ref 0.0–1.0)

## 2018-06-01 NOTE — Progress Notes (Signed)
   GYNECOLOGY OFFICE FOLLOW UP NOTE  History:  22 y.o. G1P0010 here today for follow up for secondary amenorrhea. Has had irregular bleeding, had bleeding in July of this year (prior to that, last bleeding was 06/2017). Currently she is having spotting, pink tinged on tissue when wipes for the last week.     Past Medical History:  Diagnosis Date  . Chlamydia   . Gonorrhea   . Hypertension   . Sickle cell trait [facility])     Past Surgical History:  Procedure Laterality Date  . ADENOIDECTOMY    . TONSILLECTOMY      No current outpatient medications on file.  The following portions of the patient's history were reviewed and updated as appropriate: allergies, current medications, past family history, past medical history, past social history, past surgical history and problem list.   Review of Systems:  Pertinent items noted in HPI and remainder of comprehensive ROS otherwise negative.   Objective:  Physical Exam BP 121/81   Pulse 84   Wt 216 lb 1.6 oz (98 kg)   BMI 37.68 kg/m  CONSTITUTIONAL: Well-developed, well-nourished female in no acute distress.  HENT:  Normocephalic, atraumatic. External right and left ear normal. Oropharynx is clear and moist EYES: Conjunctivae and EOM are normal. Pupils are equal, round, and reactive to light. No scleral icterus.  NECK: Normal range of motion, supple, no masses SKIN: Skin is warm and dry. No rash noted. Not diaphoretic. No erythema. No pallor. NEUROLOGIC: Alert and oriented to person, place, and time. Normal reflexes, muscle tone coordination. No cranial nerve deficit noted. PSYCHIATRIC: Normal mood and affect. Normal behavior. Normal judgment and thought content. CARDIOVASCULAR: Normal heart rate noted RESPIRATORY: Effort normal, no problems with respiration noted ABDOMEN: Soft, no distention noted.   PELVIC: deferred MUSCULOSKELETAL: Normal range of motion. No edema noted.  Labs and Imaging No results found.  Assessment & Plan:     1. PCOS (polycystic ovarian syndrome) Reviewed etiology, expected course. Reviewed likelihood of needing fertility assistance and that that is not done in this office. Reviewed need for OCPs if she is not interested in becoming pregnant in the near future to lower risk of endometrial cancer. Offered to start OCPs however she is interested in becoming pregnant in near future. Offered referral/second opinion with [facility], which she is interested in. Referral sent. - Ambulatory referral to Endocrinology  2. Hematuria, unspecified type Unclear blood in urine or vaginal spotting Udip today   Routine preventative health maintenance measures emphasized. Please refer to After Visit Summary for other counseling recommendations.   Return in about 1 year (around 06/02/2019), or if symptoms worsen or fail to improve.    Baldemar Lenis, M.D. [facility]

## 2018-06-01 NOTE — Progress Notes (Signed)
Referral form completed for [facility] office. Front office given form to faxed.

## 2018-09-28 IMAGING — DX DG ABDOMEN 1V
1 series · 1 of 1 positions shown · non-contrast
Comparison: 07/13/2016.

CLINICAL DATA: Pain for 2 weeks.

EXAM:
ABDOMEN - 1 VIEW

[abdomen kub]
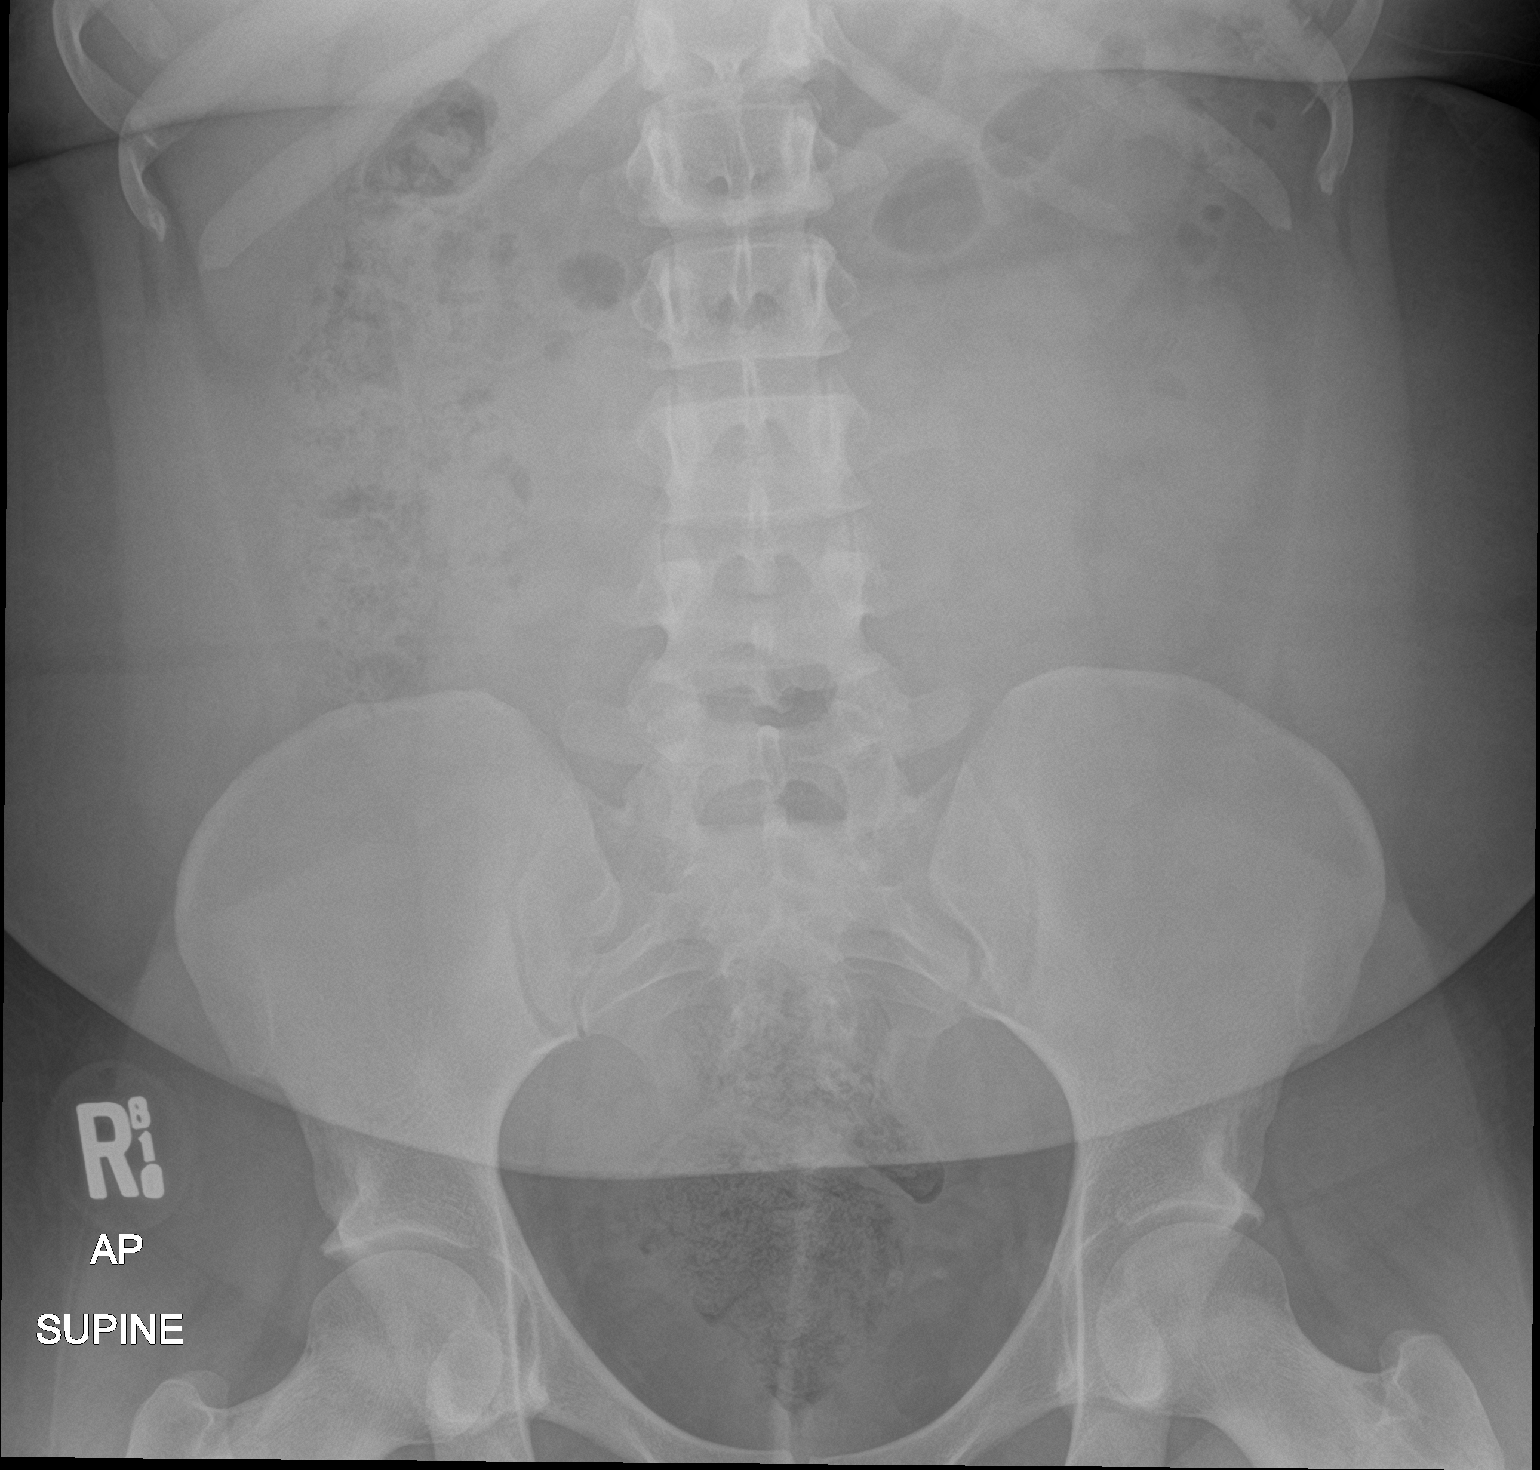

[1 of 1 positions shown; findings below may reference images not displayed]

FINDINGS: Moderate stool burden, including apparent fecal impaction. No bowel
obstruction. Large panniculus. No visible calcification or osseous
abnormality.
IMPRESSION: Moderate stool burden, query constipation. Similar appearance to
priors.

## 2019-09-28 ENCOUNTER — Encounter (HOSPITAL_COMMUNITY): Payer: Self-pay | Admitting: Urgent Care

## 2019-09-28 ENCOUNTER — Ambulatory Visit (HOSPITAL_COMMUNITY)
Admission: EM | Admit: 2019-09-28 | Discharge: 2019-09-28 | Disposition: A | Discharge status: Home / Self Care | Payer: Self-pay | Attending: Urgent Care | Admitting: Urgent Care

## 2019-09-28 ENCOUNTER — Other Ambulatory Visit: Payer: Self-pay

## 2019-09-28 DIAGNOSIS — Z3202 Encounter for pregnancy test, result negative: Secondary | ICD-10-CM

## 2019-09-28 DIAGNOSIS — N898 Other specified noninflammatory disorders of vagina: Secondary | ICD-10-CM

## 2019-09-28 DIAGNOSIS — N941 Unspecified dyspareunia: Secondary | ICD-10-CM

## 2019-09-28 LAB — POCT PREGNANCY, URINE: Preg Test, Ur: NEGATIVE

## 2019-09-28 LAB — POC URINE PREG, ED: Preg Test, Ur: NEGATIVE

## 2019-09-28 MED ORDER — FLUCONAZOLE 150 MG PO TABS: 150.0000 mg | ORAL_TABLET | ORAL | 0 refills | Status: DC

## 2019-09-28 MED ORDER — METRONIDAZOLE 500 MG PO TABS: 500.0000 mg | ORAL_TABLET | Freq: Two times a day (BID) | ORAL | 0 refills | Status: AC

## 2019-09-28 NOTE — ED Triage Notes (Signed)
Pt presents with vaginal itching & irritation, vaginal discharge, and pain during intercourse X 2 days.

## 2019-09-28 NOTE — ED Provider Notes (Signed)
[place]   MRN: [id number] DOB: 09/21/1995  Subjective:   Pamela Patterson is a 24 y.o. female presenting for 2 day hx of acute onset vaginal irritation, itching, mild vaginal discharge. Had pain during intercourse as well. Has not been sexually active for 6 months until yesterday.  Does not see a gynecologist.  Denies taking chronic medications.    No Known Allergies  Past Medical History:  Diagnosis Date  . Chlamydia   . Gonorrhea   . Hypertension   . Sickle cell trait [facility])      Past Surgical History:  Procedure Laterality Date  . ADENOIDECTOMY    . TONSILLECTOMY      Family History  Problem Relation Age of Onset  . Hypertension Mother   . Stroke Maternal Grandmother     Social History   Tobacco Use  . Smoking status: Former Smoker    Packs/day: 0.00    Types: Cigarettes  . Smokeless tobacco: Never Used  . Tobacco comment: pt denies  Substance Use Topics  . Alcohol use: Not Currently    Comment: rarely  . Drug use: No    Comment: h/o of MJ, no longer    Review of Systems  Constitutional: Negative for fever and malaise/fatigue.  HENT: Negative for congestion, ear pain, sinus pain and sore throat.   Eyes: Negative for blurred vision, double vision, discharge and redness.  Respiratory: Negative for cough, hemoptysis, shortness of breath and wheezing.   Cardiovascular: Negative for chest pain.  Gastrointestinal: Negative for abdominal pain, diarrhea, nausea and vomiting.  Genitourinary: Negative for dysuria, flank pain, frequency, hematuria and urgency.  Musculoskeletal: Negative for myalgias.  Skin: Negative for rash.  Neurological: Negative for dizziness, weakness and headaches.  Psychiatric/Behavioral: Negative for depression and substance abuse.     Objective:   Vitals: BP 139/90 (BP Location: Left Arm)   Pulse 82   Temp 98.3 F (36.8 C) (Oral)   Resp 18   SpO2 100%   Physical Exam Constitutional:      General: She is not in acute  distress.    Appearance: Normal appearance. She is well-developed. She is not ill-appearing.  HENT:     Head: Normocephalic and atraumatic.     Nose: Nose normal.     Mouth/Throat:     Mouth: Mucous membranes are moist.     Pharynx: Oropharynx is clear.  Eyes:     General: No scleral icterus.       Right eye: No discharge.        Left eye: No discharge.     Extraocular Movements: Extraocular movements intact.     Pupils: Pupils are equal, round, and reactive to light.  Cardiovascular:     Rate and Rhythm: Normal rate.  Pulmonary:     Effort: Pulmonary effort is normal.  Skin:    General: Skin is warm and dry.  Neurological:     General: No focal deficit present.     Mental Status: She is alert and oriented to person, place, and time.  Psychiatric:        Mood and Affect: Mood normal.        Behavior: Behavior normal.        Thought Content: Thought content normal.        Judgment: Judgment normal.     Results for orders placed or performed during the hospital encounter of 09/28/19 (from the past 24 hour(s))  POC urine pregnancy     Status: None  Collection Time: 09/28/19  9:06 AM  Result Value Ref Range   Preg Test, Ur NEGATIVE NEGATIVE  Pregnancy, urine POC     Status: None   Collection Time: 09/28/19  9:06 AM  Result Value Ref Range   Preg Test, Ur NEGATIVE NEGATIVE    Assessment and Plan :   1. Vaginal irritation   2. Vaginal itching   3. Dyspareunia in female   4. Vaginal discharge     Patient symptoms started before she had sex yesterday, the pain she felt prompted her to come in.  Denies history of endometriosis, uterine fibroids or cysts.  Recommended covering for yeast vaginitis and bacterial vaginosis.  Patient really does not want any empiric treatment for STIs given that her symptoms started before.  Would prefer to have labs dictate this.  Printer did not work, patient states that she would prefer to access her AVS to my chart anyway. Counseled patient  on potential for adverse effects with medications prescribed/recommended today, ER and return-to-clinic precautions discussed, patient verbalized understanding.    Wallis Bamberg, [place] 09/28/19 [phone]

## 2019-09-30 LAB — CERVICOVAGINAL ANCILLARY ONLY
Bacterial vaginitis: POSITIVE — AB
Candida vaginitis: NEGATIVE
Chlamydia: NEGATIVE
Neisseria Gonorrhea: NEGATIVE
Trichomonas: NEGATIVE

## 2021-07-25 ENCOUNTER — Encounter (HOSPITAL_COMMUNITY): Payer: Self-pay

## 2021-07-25 ENCOUNTER — Other Ambulatory Visit: Payer: Self-pay

## 2021-07-25 ENCOUNTER — Emergency Department (HOSPITAL_COMMUNITY)
Admission: EM | Admit: 2021-07-25 | Discharge: 2021-07-26 | Disposition: A | Discharge status: Home / Self Care | Payer: 59 | Attending: Emergency Medicine | Admitting: Emergency Medicine

## 2021-07-25 DIAGNOSIS — B3731 Acute candidiasis of vulva and vagina: Secondary | ICD-10-CM | POA: Diagnosis not present

## 2021-07-25 DIAGNOSIS — N898 Other specified noninflammatory disorders of vagina: Secondary | ICD-10-CM | POA: Diagnosis present

## 2021-07-25 LAB — URINALYSIS, ROUTINE W REFLEX MICROSCOPIC
Bilirubin Urine: NEGATIVE
Glucose, UA: NEGATIVE mg/dL
Hgb urine dipstick: NEGATIVE
Ketones, ur: NEGATIVE mg/dL
Nitrite: NEGATIVE
Protein, ur: NEGATIVE mg/dL
Specific Gravity, Urine: 1.014 (ref 1.005–1.030)
pH: 5 (ref 5.0–8.0)

## 2021-07-25 LAB — WET PREP, GENITAL
Clue Cells Wet Prep HPF POC: NONE SEEN
Sperm: NONE SEEN
Trich, Wet Prep: NONE SEEN
WBC, Wet Prep HPF POC: >=10 — AB (ref <10)

## 2021-07-25 LAB — PREGNANCY, URINE: Preg Test, Ur: NEGATIVE

## 2021-07-25 NOTE — ED Provider Notes (Signed)
[facility]-EMERGENCY DEPT Provider Note   CSN: [id number] Arrival date & time: 07/25/21  1921     History  Chief Complaint  Patient presents with   Vaginal Discharge    Pamela Patterson is a 26 y.o. female with no pertinent past medical history who presents today for evaluation of 2 days of vaginal irritation. She states that she switched her laundry detergent, and feels like this may be the cause.  She describes it as external and dryness and irritated. She denies any fevers.  No pelvic pain.  She denies any concern for STI, rashes or lesions.   She denies any urinary symptoms.  HPI     Home Medications Prior to Admission medications   Medication Sig Start Date End Date Taking? Authorizing Provider  fluconazole (DIFLUCAN) 150 MG tablet May repeat dose in 3 days if no improvement 07/26/21   Cristina Gong, PA-C  metroNIDAZOLE (FLAGYL) 500 MG tablet Take 1 tablet (500 mg total) by mouth 2 (two) times daily with a meal. DO NOT CONSUME ALCOHOL WHILE TAKING THIS MEDICATION. 09/28/19   Wallis Bamberg, PA-C      Allergies    Patient has no known allergies.    Review of Systems   Review of Systems  Constitutional:  Negative for chills and fever.  Genitourinary:  Positive for vaginal pain. Negative for menstrual problem, pelvic pain and vaginal bleeding.  Neurological:  Negative for weakness and headaches.  All other systems reviewed and are negative.  Physical Exam Updated Vital Signs BP (!) 126/91    Pulse 80    Temp 98.4 F (36.9 C) (Oral)    Resp 18    Ht 5\' 3"  (1.6 m)    Wt 94.3 kg    SpO2 99%    BMI 36.85 kg/m  Physical Exam Vitals and nursing note reviewed.  Constitutional:      General: She is not in acute distress.    Appearance: She is not ill-appearing or diaphoretic.  HENT:     Head: Normocephalic and atraumatic.  Eyes:     General: No scleral icterus.       Right eye: No discharge.        Left eye: No discharge.     Conjunctiva/sclera:  Conjunctivae normal.  Cardiovascular:     Rate and Rhythm: Normal rate and regular rhythm.  Pulmonary:     Effort: Pulmonary effort is normal. No respiratory distress.     Breath sounds: No stridor.  Abdominal:     General: There is no distension.  Genitourinary:    Comments: deferred Musculoskeletal:        General: No deformity.     Cervical back: Normal range of motion and neck supple.  Skin:    General: Skin is dry.  Neurological:     General: No focal deficit present.     Mental Status: She is alert.     Motor: No abnormal muscle tone.  Psychiatric:        Mood and Affect: Mood normal.        Behavior: Behavior normal.    ED Results / Procedures / Treatments   Labs (all labs ordered are listed, but only abnormal results are displayed) Labs Reviewed  WET PREP, GENITAL - Abnormal; Notable for the following components:      Result Value   Yeast Wet Prep HPF POC PRESENT (*)    WBC, Wet Prep HPF POC >=10 (*)    All other components within  normal limits  URINALYSIS, ROUTINE W REFLEX MICROSCOPIC - Abnormal; Notable for the following components:   APPearance HAZY (*)    Leukocytes,Ua LARGE (*)    Bacteria, UA RARE (*)    All other components within normal limits  PREGNANCY, URINE    EKG None  Radiology No results found.  Procedures Procedures    Medications Ordered in ED Medications - No data to display  ED Course/ Medical Decision Making/ A&P                           Medical Decision Making  Patient is a 26 year old woman who presents today for evaluation of vulvar irritation.  She denies any pelvic pain or fevers and has no urinary symptoms. She denies any genital sores or lesions.  She is not concerned for STI stating she has been with the same partner for many years in a monogamous relationship. UA was obtained which does show rare bacteria and 11-20 squamous epithelial cells suggestive of contamination.  This is further supported by budding yeast being  present which I suspect is a vaginal contaminant. She was allowed to self swab with wet prep showing yeast present. Urine pregnancy test was negative. Testing for gonorrhea, chlamydia, or other STIs is considered.  I discussed options for this with the patient versus outpatient follow-up.  As patient states she has a low suspicion for STI she elects for outpatient follow-up as needed. We did discuss option for physical evaluation including consideration of both external genital exam and pelvic exam, patient declined these at this time. Given that she does not have pelvic pain or fevers low suspicion for PID.  Patient will be treated with Diflucan. I did give her the option for topical treatments which she declined.  Return precautions were discussed with patient who states their understanding.  At the time of discharge patient denied any unaddressed complaints or concerns.  Patient is agreeable for discharge home.  Note: Portions of this report may have been transcribed using voice recognition software. Every effort was made to ensure accuracy; however, inadvertent computerized transcription errors may be present    Final Clinical Impression(s) / ED Diagnoses Final diagnoses:  Yeast vaginitis    Rx / DC Orders ED Discharge Orders          Ordered    fluconazole (DIFLUCAN) 150 MG tablet        07/26/21 0002              Cristina Gong, [place] 07/26/21 0636    Tilden Fossa, MD 07/26/21 [phone]

## 2021-07-25 NOTE — ED Triage Notes (Signed)
Pt c/o white and yellow vaginal discharge with itching and discomfort x2 days. Pt states she is sexually active and does not always use a condom.

## 2021-07-25 NOTE — ED Provider Triage Note (Signed)
Emergency Medicine Provider Triage Evaluation Note  Pamela Patterson , a 26 y.o. female  was evaluated in triage.  Pt complains of vaginal itching, burning. No abnormal dc. No new partners. Possibly related to her washing powder.   Review of Systems  Positive: Vaginal itching, burning Negative: fever  Physical Exam  BP (!) 130/97 (BP Location: Left Arm)    Pulse 81    Temp 98.4 F (36.9 C) (Oral)    Resp 18    Ht 5\' 3"  (1.6 m)    Wt 94.3 kg    SpO2 92%    BMI 36.85 kg/m  Gen:   Awake, no distress   Resp:  Normal effort  MSK:   Moves extremities without difficulty  Other:    Medical Decision Making  Medically screening exam initiated at 8:33 PM.  Appropriate orders placed.  Pamela Patterson was informed that the remainder of the evaluation will be completed by another provider, this initial triage assessment does not replace that evaluation, and the importance of remaining in the ED until their evaluation is complete.  Will offer self collect wet prep.    Pamela Presto, Pamela Patterson 07/25/21 2034

## 2021-07-26 MED ORDER — FLUCONAZOLE 150 MG PO TABS: ORAL_TABLET | ORAL | 0 refills | Status: AC

## 2021-07-26 NOTE — Discharge Instructions (Signed)
Today presented prescription for Diflucan.  This is a medication to treat vaginal yeast infection. If your symptoms do not start to improve in 3 days after the first dose you may take the second dose.
